# Patient Record
Sex: Male | Born: 1935 | Race: White | Hispanic: No | Smoking: Never smoker
Health system: Southern US, Community
[De-identification: ages and names within clinical notes are randomized; demographics above are authoritative.]

## PROBLEM LIST (undated history)

## (undated) DIAGNOSIS — I10 Essential (primary) hypertension: Secondary | ICD-10-CM

## (undated) DIAGNOSIS — R972 Elevated prostate specific antigen [PSA]: Secondary | ICD-10-CM

## (undated) DIAGNOSIS — I2510 Atherosclerotic heart disease of native coronary artery without angina pectoris: Secondary | ICD-10-CM

## (undated) DIAGNOSIS — E785 Hyperlipidemia, unspecified: Secondary | ICD-10-CM

## (undated) DIAGNOSIS — I219 Acute myocardial infarction, unspecified: Secondary | ICD-10-CM

## (undated) DIAGNOSIS — K219 Gastro-esophageal reflux disease without esophagitis: Secondary | ICD-10-CM

## (undated) DIAGNOSIS — Z86010 Personal history of colonic polyps: Secondary | ICD-10-CM

## (undated) DIAGNOSIS — I493 Ventricular premature depolarization: Secondary | ICD-10-CM

## (undated) DIAGNOSIS — I679 Cerebrovascular disease, unspecified: Secondary | ICD-10-CM

## (undated) HISTORY — DX: Essential (primary) hypertension: I10

## (undated) HISTORY — DX: Gastro-esophageal reflux disease without esophagitis: K21.9

## (undated) HISTORY — DX: Ventricular premature depolarization: I49.3

## (undated) HISTORY — DX: Personal history of colonic polyps: Z86.010

## (undated) HISTORY — DX: Acute myocardial infarction, unspecified: I21.9

## (undated) HISTORY — DX: Cerebrovascular disease, unspecified: I67.9

## (undated) HISTORY — DX: Hyperlipidemia, unspecified: E78.5

## (undated) HISTORY — DX: Atherosclerotic heart disease of native coronary artery without angina pectoris: I25.10

## (undated) HISTORY — PX: TONSILLECTOMY: SUR1361

## (undated) HISTORY — PX: EYE SURGERY: SHX253

## (undated) HISTORY — PX: APPENDECTOMY: SHX54

## (undated) HISTORY — DX: Elevated prostate specific antigen (PSA): R97.20

---

## 1995-05-13 HISTORY — PX: CORONARY ARTERY BYPASS GRAFT: SHX141

## 2004-08-29 ENCOUNTER — Ambulatory Visit: Payer: Self-pay

## 2004-11-20 ENCOUNTER — Ambulatory Visit: Payer: Self-pay | Admitting: Cardiology

## 2004-11-28 ENCOUNTER — Ambulatory Visit: Payer: Self-pay

## 2005-06-19 ENCOUNTER — Ambulatory Visit (HOSPITAL_COMMUNITY): Admission: RE | Admit: 2005-06-19 | Discharge: 2005-06-19 | Payer: Self-pay | Admitting: Ophthalmology

## 2005-12-02 ENCOUNTER — Ambulatory Visit: Payer: Self-pay | Admitting: Cardiology

## 2005-12-24 ENCOUNTER — Ambulatory Visit: Payer: Self-pay

## 2005-12-24 ENCOUNTER — Ambulatory Visit: Payer: Self-pay | Admitting: Cardiology

## 2006-11-10 ENCOUNTER — Ambulatory Visit: Payer: Self-pay

## 2006-11-19 ENCOUNTER — Ambulatory Visit: Payer: Self-pay | Admitting: Cardiology

## 2006-12-03 ENCOUNTER — Ambulatory Visit: Payer: Self-pay

## 2006-12-03 LAB — CONVERTED CEMR LAB
Albumin: 3.6 g/dL (ref 3.5–5.2)
Bilirubin, Direct: 0.1 mg/dL (ref 0.0–0.3)
CO2: 30 meq/L (ref 19–32)
Creatinine, Ser: 1.1 mg/dL (ref 0.4–1.5)
HDL: 38 mg/dL — ABNORMAL LOW (ref >39.0)
Potassium: 4.2 meq/L (ref 3.5–5.1)
Sodium: 142 meq/L (ref 135–145)
Total Bilirubin: 1.1 mg/dL (ref 0.3–1.2)
Total Protein: 6.3 g/dL (ref 6.0–8.3)
Triglycerides: 125 mg/dL (ref 0–149)
VLDL: 25 mg/dL (ref 0–40)

## 2007-11-04 ENCOUNTER — Ambulatory Visit: Payer: Self-pay | Admitting: Cardiology

## 2007-11-10 ENCOUNTER — Ambulatory Visit: Payer: Self-pay

## 2007-11-10 LAB — CONVERTED CEMR LAB
AST: 20 units/L (ref 0–37)
Bilirubin, Direct: 0.2 mg/dL (ref 0.0–0.3)
Chloride: 109 meq/L (ref 96–112)
Cholesterol: 114 mg/dL (ref 0–200)
Creatinine, Ser: 1.3 mg/dL (ref 0.4–1.5)
Glucose, Bld: 100 mg/dL — ABNORMAL HIGH (ref 70–99)
HDL: 32.5 mg/dL — ABNORMAL LOW (ref >39.0)
Magnesium: 2.2 mg/dL (ref 1.5–2.5)
Sodium: 143 meq/L (ref 135–145)
Total Bilirubin: 1 mg/dL (ref 0.3–1.2)

## 2007-11-24 ENCOUNTER — Ambulatory Visit: Payer: Self-pay | Admitting: Cardiology

## 2007-12-29 ENCOUNTER — Ambulatory Visit: Payer: Self-pay | Admitting: Cardiology

## 2007-12-29 LAB — CONVERTED CEMR LAB
AST: 20 units/L (ref 0–37)
Albumin: 3.6 g/dL (ref 3.5–5.2)
Alkaline Phosphatase: 38 units/L — ABNORMAL LOW (ref 39–117)
Total Bilirubin: 1.1 mg/dL (ref 0.3–1.2)
Total CHOL/HDL Ratio: 3
VLDL: 18 mg/dL (ref 0–40)

## 2008-06-05 ENCOUNTER — Ambulatory Visit: Payer: Self-pay | Admitting: Cardiology

## 2008-10-18 ENCOUNTER — Ambulatory Visit: Payer: Self-pay

## 2008-10-18 ENCOUNTER — Ambulatory Visit: Payer: Self-pay | Admitting: Cardiology

## 2008-10-18 LAB — CONVERTED CEMR LAB
AST: 24 units/L (ref 0–37)
Chloride: 107 meq/L (ref 96–112)
Creatinine, Ser: 1 mg/dL (ref 0.4–1.5)
GFR calc Af Amer: 94 mL/min
GFR calc non Af Amer: 78 mL/min
LDL Cholesterol: 59 mg/dL (ref 0–99)
Total Bilirubin: 1 mg/dL (ref 0.3–1.2)
Total CHOL/HDL Ratio: 2.7
Triglycerides: 72 mg/dL (ref 0–149)
VLDL: 14 mg/dL (ref 0–40)

## 2009-01-28 ENCOUNTER — Telehealth: Payer: Self-pay | Admitting: Cardiology

## 2009-03-22 ENCOUNTER — Encounter (INDEPENDENT_AMBULATORY_CARE_PROVIDER_SITE_OTHER): Payer: Self-pay | Admitting: *Deleted

## 2009-05-23 DIAGNOSIS — I4949 Other premature depolarization: Secondary | ICD-10-CM | POA: Insufficient documentation

## 2009-05-23 DIAGNOSIS — I2510 Atherosclerotic heart disease of native coronary artery without angina pectoris: Secondary | ICD-10-CM | POA: Insufficient documentation

## 2009-05-23 DIAGNOSIS — I679 Cerebrovascular disease, unspecified: Secondary | ICD-10-CM | POA: Insufficient documentation

## 2009-05-23 DIAGNOSIS — E785 Hyperlipidemia, unspecified: Secondary | ICD-10-CM | POA: Insufficient documentation

## 2009-05-28 ENCOUNTER — Ambulatory Visit: Payer: Self-pay | Admitting: Cardiology

## 2009-05-28 ENCOUNTER — Encounter (INDEPENDENT_AMBULATORY_CARE_PROVIDER_SITE_OTHER): Payer: Self-pay | Admitting: *Deleted

## 2009-07-23 ENCOUNTER — Ambulatory Visit (HOSPITAL_COMMUNITY): Admission: RE | Admit: 2009-07-23 | Discharge: 2009-07-23 | Payer: Self-pay | Admitting: Cardiology

## 2009-09-17 ENCOUNTER — Telehealth (INDEPENDENT_AMBULATORY_CARE_PROVIDER_SITE_OTHER): Payer: Self-pay | Admitting: *Deleted

## 2010-01-09 ENCOUNTER — Telehealth: Payer: Self-pay | Admitting: Cardiology

## 2010-02-20 ENCOUNTER — Telehealth: Payer: Self-pay | Admitting: Cardiology

## 2010-02-20 DIAGNOSIS — R972 Elevated prostate specific antigen [PSA]: Secondary | ICD-10-CM

## 2010-02-20 HISTORY — DX: Elevated prostate specific antigen (PSA): R97.20

## 2010-05-23 ENCOUNTER — Ambulatory Visit: Payer: Self-pay | Admitting: Cardiology

## 2010-06-04 ENCOUNTER — Telehealth (INDEPENDENT_AMBULATORY_CARE_PROVIDER_SITE_OTHER): Payer: Self-pay | Admitting: *Deleted

## 2010-06-05 ENCOUNTER — Encounter: Payer: Self-pay | Admitting: Cardiology

## 2010-06-05 ENCOUNTER — Ambulatory Visit: Payer: Self-pay | Admitting: Cardiology

## 2010-06-05 ENCOUNTER — Ambulatory Visit: Payer: Self-pay

## 2010-06-05 ENCOUNTER — Encounter (HOSPITAL_COMMUNITY): Admission: RE | Admit: 2010-06-05 | Discharge: 2010-07-09 | Payer: Self-pay | Admitting: Cardiology

## 2010-06-10 LAB — CONVERTED CEMR LAB
ALT: 15 units/L (ref 0–53)
AST: 22 units/L (ref 0–37)
Albumin: 3.6 g/dL (ref 3.5–5.2)
CO2: 29 meq/L (ref 19–32)
Chloride: 105 meq/L (ref 96–112)
Glucose, Bld: 114 mg/dL — ABNORMAL HIGH (ref 70–99)
HDL: 39.4 mg/dL (ref >39.00)
Potassium: 4.1 meq/L (ref 3.5–5.1)
Sodium: 139 meq/L (ref 135–145)
Total Bilirubin: 0.9 mg/dL (ref 0.3–1.2)
Total CHOL/HDL Ratio: 3
Triglycerides: 112 mg/dL (ref 0.0–149.0)
VLDL: 22.4 mg/dL (ref 0.0–40.0)

## 2010-06-19 ENCOUNTER — Telehealth: Payer: Self-pay | Admitting: Cardiology

## 2010-06-27 ENCOUNTER — Ambulatory Visit: Payer: Self-pay | Admitting: Internal Medicine

## 2010-06-27 DIAGNOSIS — Z860100 Personal history of colon polyps, unspecified: Secondary | ICD-10-CM | POA: Insufficient documentation

## 2010-06-27 DIAGNOSIS — K219 Gastro-esophageal reflux disease without esophagitis: Secondary | ICD-10-CM | POA: Insufficient documentation

## 2010-06-27 DIAGNOSIS — I10 Essential (primary) hypertension: Secondary | ICD-10-CM | POA: Insufficient documentation

## 2010-06-27 LAB — CONVERTED CEMR LAB
Cholesterol, target level: 200 mg/dL
HDL goal, serum: 40 mg/dL

## 2010-07-01 ENCOUNTER — Encounter: Admission: RE | Admit: 2010-07-01 | Discharge: 2010-07-01 | Payer: Self-pay | Admitting: Internal Medicine

## 2010-07-01 ENCOUNTER — Encounter: Payer: Self-pay | Admitting: Internal Medicine

## 2010-07-07 ENCOUNTER — Ambulatory Visit: Payer: Self-pay | Admitting: Internal Medicine

## 2010-07-07 DIAGNOSIS — I714 Abdominal aortic aneurysm, without rupture, unspecified: Secondary | ICD-10-CM | POA: Insufficient documentation

## 2010-07-07 DIAGNOSIS — K8020 Calculus of gallbladder without cholecystitis without obstruction: Secondary | ICD-10-CM | POA: Insufficient documentation

## 2010-07-09 ENCOUNTER — Telehealth: Payer: Self-pay | Admitting: Cardiology

## 2010-07-15 ENCOUNTER — Ambulatory Visit: Payer: Self-pay | Admitting: Vascular Surgery

## 2010-07-15 ENCOUNTER — Encounter: Payer: Self-pay | Admitting: Cardiology

## 2010-08-07 ENCOUNTER — Telehealth (INDEPENDENT_AMBULATORY_CARE_PROVIDER_SITE_OTHER): Payer: Self-pay | Admitting: *Deleted

## 2010-08-12 ENCOUNTER — Ambulatory Visit (HOSPITAL_COMMUNITY)
Admission: RE | Admit: 2010-08-12 | Discharge: 2010-08-12 | Payer: Self-pay | Source: Home / Self Care | Admitting: General Surgery

## 2010-08-12 HISTORY — PX: CHOLECYSTECTOMY: SHX55

## 2010-08-28 ENCOUNTER — Ambulatory Visit: Payer: Self-pay | Admitting: Internal Medicine

## 2010-09-09 ENCOUNTER — Encounter: Payer: Self-pay | Admitting: Internal Medicine

## 2010-09-17 ENCOUNTER — Telehealth: Payer: Self-pay | Admitting: Cardiology

## 2010-09-23 ENCOUNTER — Encounter: Payer: Self-pay | Admitting: Gastroenterology

## 2010-10-22 ENCOUNTER — Encounter (INDEPENDENT_AMBULATORY_CARE_PROVIDER_SITE_OTHER): Payer: Self-pay | Admitting: *Deleted

## 2010-10-23 ENCOUNTER — Encounter: Payer: Self-pay | Admitting: Cardiology

## 2010-10-23 ENCOUNTER — Ambulatory Visit
Admission: RE | Admit: 2010-10-23 | Discharge: 2010-10-23 | Payer: Self-pay | Source: Home / Self Care | Attending: Gastroenterology | Admitting: Gastroenterology

## 2010-10-24 ENCOUNTER — Ambulatory Visit: Admission: RE | Admit: 2010-10-24 | Discharge: 2010-10-24 | Payer: Self-pay | Source: Home / Self Care

## 2010-10-24 ENCOUNTER — Encounter: Payer: Self-pay | Admitting: Cardiology

## 2010-11-05 ENCOUNTER — Telehealth: Payer: Self-pay | Admitting: Cardiology

## 2010-11-06 ENCOUNTER — Other Ambulatory Visit: Payer: Self-pay | Admitting: Gastroenterology

## 2010-11-06 ENCOUNTER — Ambulatory Visit
Admission: RE | Admit: 2010-11-06 | Discharge: 2010-11-06 | Payer: Self-pay | Source: Home / Self Care | Attending: Gastroenterology | Admitting: Gastroenterology

## 2010-11-06 DIAGNOSIS — D1330 Benign neoplasm of unspecified part of small intestine: Secondary | ICD-10-CM

## 2010-11-09 LAB — CONVERTED CEMR LAB
ALT: 17 units/L (ref 0–53)
AST: 21 units/L (ref 0–37)
Alkaline Phosphatase: 53 units/L (ref 39–117)
Bilirubin, Direct: 0.1 mg/dL (ref 0.0–0.3)
Chloride: 108 meq/L (ref 96–112)
Cholesterol: 134 mg/dL (ref 0–200)
GFR calc non Af Amer: 62.72 mL/min (ref >60)
LDL Cholesterol: 74 mg/dL (ref 0–99)
Potassium: 4.4 meq/L (ref 3.5–5.1)
Sodium: 142 meq/L (ref 135–145)
Total Bilirubin: 1.1 mg/dL (ref 0.3–1.2)
Total CHOL/HDL Ratio: 3
VLDL: 16 mg/dL (ref 0.0–40.0)

## 2010-11-11 ENCOUNTER — Encounter: Payer: Self-pay | Admitting: Gastroenterology

## 2010-11-13 NOTE — Letter (Signed)
Summary: Medical Clearance/[facility]  [facility] Medical Clearance   Imported By: Roderic Ovens 07/30/2010 14:18:31  _____________________________________________________________________  External Attachment:    Type:   Image     Comment:   External Document

## 2010-11-13 NOTE — Letter (Signed)
Summary: [facility] Instructions  [facility]  [address] [place], [place] [address]   Phone: [phone]  Fax: [phone]       [name]    1932/05/24    MRN: [id number]        Procedure Day /Date:  Thursday 11/06/2010     Arrival Time: 9:30 am     Procedure Time:  10:30 am     Location of Procedure:                    _x _  [facility] (4th Floor)                        PREPARATION FOR COLONOSCOPY WITH MOVIPREP   Starting 5 days prior to your procedure Saturday 1/21 do not eat nuts, seeds, popcorn, corn, beans, peas,  salads, or any raw vegetables.  Do not take any fiber supplements (e.g. Metamucil, Citrucel, and Benefiber).  THE DAY BEFORE YOUR PROCEDURE         DATE: Wednesday 1/25  1.  Drink clear liquids the entire day-NO SOLID FOOD  2.  Do not drink anything colored red or purple.  Avoid juices with pulp.  No orange juice.  3.  Drink at least 64 oz. (8 glasses) of fluid/clear liquids during the day to prevent dehydration and help the prep work efficiently.  CLEAR LIQUIDS INCLUDE: Water Jello Ice Popsicles Tea (sugar ok, no milk/cream) Powdered fruit flavored drinks Coffee (sugar ok, no milk/cream) Gatorade Juice: apple, white grape, white cranberry  Lemonade Clear bullion, consomm, broth Carbonated beverages (any kind) Strained chicken noodle soup Hard Candy                             4.  In the morning, mix first dose of MoviPrep solution:    Empty 1 Pouch A and 1 Pouch B into the disposable container    Add lukewarm drinking water to the top line of the container. Mix to dissolve    Refrigerate (mixed solution should be used within 24 hrs)  5.  Begin drinking the prep at 5:00 p.m. The MoviPrep container is divided by 4 marks.   Every 15 minutes drink the solution down to the next mark (approximately 8 oz) until the full liter is complete.   6.  Follow completed prep with 16 oz of clear liquid of your choice  (Nothing red or purple).  Continue to drink clear liquids until bedtime.  7.  Before going to bed, mix second dose of MoviPrep solution:    Empty 1 Pouch A and 1 Pouch B into the disposable container    Add lukewarm drinking water to the top line of the container. Mix to dissolve    Refrigerate  THE DAY OF YOUR PROCEDURE      DATE: Thursday 1/26  Beginning at 5:30 a.m. (5 hours before procedure):         1. Every 15 minutes, drink the solution down to the next mark (approx 8 oz) until the full liter is complete.  2. Follow completed prep with 16 oz. of clear liquid of your choice.    3. You may drink clear liquids until 8:30 am (2 HOURS BEFORE PROCEDURE).   MEDICATION INSTRUCTIONS  Unless otherwise instructed, you should take regular prescription medications with a small sip of water   as early as possible the morning of  your procedure.           OTHER INSTRUCTIONS  You will need a responsible adult at least 75 years of age to accompany you and drive you home.   This person must remain in the waiting room during your procedure.  Wear loose fitting clothing that is easily removed.  Leave jewelry and other valuables at home.  However, you may wish to bring a book to read or  an iPod/MP3 player to listen to music as you wait for your procedure to start.  Remove all body piercing jewelry and leave at home.  Total time from sign-in until discharge is approximately 2-3 hours.  You should go home directly after your procedure and rest.  You can resume normal activities the  day after your procedure.  The day of your procedure you should not:   Drive   Make legal decisions   Operate machinery   Drink alcohol   Return to work  You will receive specific instructions about eating, activities and medications before you leave.    The above instructions have been reviewed and explained to me by   Ezra Sites RN  October 23, 2010 8:53 AM     I fully understand  and can verbalize these instructions _____________________________ Date _________

## 2010-11-13 NOTE — Progress Notes (Signed)
Summary: Nuc Pre-Procedure  Phone Note Outgoing Call Call back at [facility] [place] Phone [phone]   Call placed by: Antionette Char RN,  June 04, 2010 1:21 PM Call placed to: Patient Reason for Call: Confirm/change Appt Summary of Call: Reviewed information on [employer] Information Sheet (see scanned document for further details).  Spoke with patient.     Nuclear Med Background Indications for Stress Test: Evaluation for Ischemia, Graft Patency   History: CABG, Myocardial Infarction, Myocardial Perfusion Study  History Comments: 1996 IWMI 2009 MPS No Ischemia  Symptoms: DOE    Nuclear Pre-Procedure Cardiac Risk Factors: Carotid Disease, RBBB Height (in): 72

## 2010-11-13 NOTE — Progress Notes (Signed)
  Phone Note Outgoing Call   Call placed by: Scherrie Bateman, LPN,  July 09, 2010 10:13 AM Call placed to: Patient Summary of Call: CALL PLACED TO PT  TO INFORM OF ABD U/S RESULTS.  WHILE GIVING RESULTS PT C/O  WHEN BENDS OVER  NOTES FEELING "WOOZY" LIGHTHEADED.EX TEEING UP FOR GOLF Initial call taken by: Scherrie Bateman, LPN,  July 09, 2010 10:16 AM  Follow-up for Phone Call        continue same and monitor BP Ferman Hamming, MD, [facility]  July 09, 2010 10:19 AM  [facility] Scherrie Bateman, LPN  July 09, 2010 11:52 AM  pt returned call [phone] Glynda Jaeger  July 09, 2010 12:00 PM   Additional Follow-up for Phone Call Additional follow up Details #1::        PT AWARE TO MONITOR B/P AND TO CALL IN APPROX 1 WEEK WITH READINGS  AND TO GIVE UPDATE VERBALIZED UNDERSTANDING Additional Follow-up by: Scherrie Bateman, LPN,  July 09, 2010 12:29 PM

## 2010-11-13 NOTE — Letter (Signed)
Summary: [facility]   Imported By: Sherian Rein 09/16/2010 15:17:29  _____________________________________________________________________  External Attachment:    Type:   Image     Comment:   External Document

## 2010-11-13 NOTE — Miscellaneous (Signed)
Summary: lec pv  Clinical Lists Changes  Medications: Added new medication of MOVIPREP 100 GM  SOLR (PEG-KCL-NACL-NASULF-NA ASC-C) As per prep instructions. - Signed Rx of MOVIPREP 100 GM  SOLR (PEG-KCL-NACL-NASULF-NA ASC-C) As per prep instructions.;  #1 x 0;  Signed;  Entered by: Ezra Sites RN;  Authorized by: Meryl Dare MD [facility];  Method used: Electronically to [id number] [address] [place], [place]  [address], Ph: [phone] or [phone], Fax: [phone] Observations: Added new observation of NKA: T (10/23/2010 8:16)    Prescriptions: MOVIPREP 100 GM  SOLR (PEG-KCL-NACL-NASULF-NA ASC-C) As per prep instructions.  #1 x 0   Entered by:   Ezra Sites RN   Authorized by:   Meryl Dare MD [facility]   Signed by:   Ezra Sites RN on 10/23/2010   Method used:   Electronically to        [id number] (retail)       [address]       [place], [place]  [address]       Ph: [phone] or [phone]       Fax: [phone]   RxID:   [id number]   Appended Document: lec pv Dr. Yetta Barre problem list says that pt has a hx of colon polyps.  Pt says that he has never had a colonoscopy.

## 2010-11-13 NOTE — Assessment & Plan Note (Signed)
Summary: follow up on test results-lb   Vital Signs:  Patient profile:   77 year old male Height:      72 inches Weight:      193.25 pounds BMI:     26.30 O2 Sat:      95 % on Room air Temp:     97.8 degrees F oral Pulse rate:   66 / minute Pulse rhythm:   regular Resp:     16 per minute BP sitting:   122 / 74  (left arm) Cuff size:   large  Vitals Entered By: Rock Nephew CMA (July 07, 2010 8:14 AM)  Nutrition Counseling: Patient's BMI is greater than 25 and therefore counseled on weight management options.  O2 Flow:  Room air CC: follow-up visit//discuss lab work Pain Assessment Patient in pain? no       Does patient need assistance? Functional Status Self care Ambulation Normal   Primary Care Provider:  Etta Grandchild MD  CC:  follow-up visit//discuss lab work.  History of Present Illness: He returns for f/up on the abd u/s. It found one gallstone and a small/distal AAA. He has not had anymore abd pain and feels well with no complaints today.  Preventive Screening-Counseling & Management  Alcohol-Tobacco     Alcohol drinks/day: 0     Smoking Status: never     Tobacco Counseling: not indicated; no tobacco use  Hep-HIV-STD-Contraception     Hepatitis Risk: no risk noted     HIV Risk: no risk noted     STD Risk: no risk noted      Sexual History:  currently monogamous.        Drug Use:  never.        Blood Transfusions:  no.    Clinical Review Panels:  Lipid Management   Cholesterol:  114 (06/05/2010)   LDL (bad choesterol):  52 (06/05/2010)   HDL (good cholesterol):  39.40 (06/05/2010)  Complete Metabolic Panel   Glucose:  114 (06/05/2010)   Sodium:  139 (06/05/2010)   Potassium:  4.1 (06/05/2010)   Chloride:  105 (06/05/2010)   CO2:  29 (06/05/2010)   BUN:  15 (06/05/2010)   Creatinine:  1.2 (06/05/2010)   Albumin:  3.6 (06/05/2010)   Total Protein:  6.2 (06/05/2010)   Calcium:  9.5 (06/05/2010)   Total Bili:  0.9 (06/05/2010)  Alk Phos:  49 (06/05/2010)   SGPT (ALT):  15 (06/05/2010)   SGOT (AST):  22 (06/05/2010)   Medications Prior to Update: 1)  Altace 5 Mg Caps (Ramipril) .... Take 1 Capsule By Mouth Once A Day 2)  Niaspan 1000 Mg Cr-Tabs (Niacin (Antihyperlipidemic)) .Marland Kitchen.. 1 Tablet By Mouth At Bedtime 3)  Pravastatin Sodium 40 Mg Tabs (Pravastatin Sodium) .... Take 1 Tablet By Mouth At Bedtime 4)  Multivitamins   Tabs (Multiple Vitamin) .Marland Kitchen.. 1 Tab By Mouth Once Daily 5)  Aspirin 81 Mg  Tabs (Aspirin) .Marland Kitchen.. 1 Tab By Mouth Once Daily 6)  Calcium 600-200 Mg-Unit Tabs (Calcium-Vitamin D) .... Take 1 By Mouth Daily 7)  Vitamin C 500 Mg Tabs (Ascorbic Acid) .Marland Kitchen.. 1 Tab By Mouth Once Daily  Current Medications (verified): 1)  Altace 5 Mg Caps (Ramipril) .... Take 1 Capsule By Mouth Once A Day 2)  Niaspan 1000 Mg Cr-Tabs (Niacin (Antihyperlipidemic)) .Marland Kitchen.. 1 Tablet By Mouth At Bedtime 3)  Pravastatin Sodium 40 Mg Tabs (Pravastatin Sodium) .... Take 1 Tablet By Mouth At Bedtime 4)  Multivitamins   Tabs (Multiple Vitamin) .Marland KitchenMarland KitchenMarland Kitchen  1 Tab By Mouth Once Daily 5)  Aspirin 81 Mg  Tabs (Aspirin) .Marland Kitchen.. 1 Tab By Mouth Once Daily 6)  Calcium 600-200 Mg-Unit Tabs (Calcium-Vitamin D) .... Take 1 By Mouth Daily 7)  Vitamin C 500 Mg Tabs (Ascorbic Acid) .Marland Kitchen.. 1 Tab By Mouth Once Daily  Allergies (verified): No Known Drug Allergies  Past History:  Past Medical History: Last updated: 06/27/2010 CEREBROVASCULAR DISEASE (ICD-437.9) PREMATURE VENTRICULAR CONTRACTIONS (ICD-427.69) HYPERLIPIDEMIA (ICD-272.4) CAD (ICD-414.00) Colonic polyps, hx of GERD Hypertension PSA= 1.7 on 02/20/2010 with Dr. Earlene Plater  Past Surgical History: Last updated: 06/27/2010 status post coronary artery bypassing graft. August 1996 with LIMA to the LAD and diagonal, saphenous vein graft to the intermediate, obtuse marginal and circumflex, and a saphenous vein graft to the right coronary artery and PDA. Appendectomy Tonsillectomy  Family History: Last  updated: 06/27/2010 Family History of Prostate CA 1st degree relative <50  Social History: Last updated: 06/27/2010 Tobacco Use - No.  Alcohol Use - no Retired Widow/Widower Regular exercise-yes  Risk Factors: Alcohol Use: 0 (07/07/2010) Exercise: yes (06/27/2010)  Risk Factors: Smoking Status: never (07/07/2010)  Family History: Reviewed history from 06/27/2010 and no changes required. Family History of Prostate CA 1st degree relative <50  Social History: Reviewed history from 06/27/2010 and no changes required. Tobacco Use - No.  Alcohol Use - no Retired Widow/Widower Regular exercise-yes  Review of Systems  The patient denies anorexia, fever, weight loss, weight gain, chest pain, syncope, dyspnea on exertion, peripheral edema, prolonged cough, headaches, hemoptysis, abdominal pain, and hematuria.   CV:  Denies chest pain or discomfort, fainting, fatigue, leg cramps with exertion, lightheadness, near fainting, palpitations, and shortness of breath with exertion.  Physical Exam  General:  alert, well-developed, well-nourished, well-hydrated, appropriate dress, normal appearance, healthy-appearing, cooperative to examination, and good hygiene.   Eyes:  vision grossly intact, pupils equal, pupils round, and pupils reactive to light.   Mouth:  Oral mucosa and oropharynx without lesions or exudates.  Teeth in good repair. Neck:  supple, full ROM, no masses, no thyromegaly, no thyroid nodules or tenderness, no JVD, normal carotid upstroke, no carotid bruits, no cervical lymphadenopathy, and no neck tenderness.   Lungs:  normal respiratory effort, no intercostal retractions, no accessory muscle use, normal breath sounds, no dullness, no fremitus, no crackles, and no wheezes.   Heart:  normal rate, regular rhythm, no murmur, no gallop, no rub, and no JVD.   Abdomen:  soft, non-tender, normal bowel sounds, no distention, no masses, no guarding, no rigidity, no rebound tenderness,  no abdominal hernia, no inguinal hernia, no hepatomegaly, and no splenomegaly.   Msk:  No deformity or scoliosis noted of thoracic or lumbar spine.   Pulses:  R and L carotid,radial,femoral,dorsalis pedis and posterior tibial pulses are full and equal bilaterally Extremities:  No clubbing, cyanosis, edema, or deformity noted with normal full range of motion of all joints.   Neurologic:  No cranial nerve deficits noted. Station and gait are normal. Plantar reflexes are down-going bilaterally. DTRs are symmetrical throughout. Sensory, motor and coordinative functions appear intact. Skin:  turgor normal, color normal, no rashes, no suspicious lesions, no ecchymoses, no petechiae, no purpura, no ulcerations, no edema, excessive tan, and solar damage.   Cervical Nodes:  no anterior cervical adenopathy and no posterior cervical adenopathy.   Psych:  Cognition and judgment appear intact. Alert and cooperative with normal attention span and concentration. No apparent delusions, illusions, hallucinations   Impression & Recommendations:  Problem # 1:  CHOLELITHIASIS (ICD-574.20) Assessment New he may need a cholecystectomy Orders: Surgical Referral (Surgery)  Problem # 2:  ABDOMINAL ANEURYSM WITHOUT MENTION OF RUPTURE (ICD-441.4) Assessment: New will see if vascular surgery has any recommendations Orders: Vascular Clinic (Vascular)  Problem # 3:  HYPERTENSION (ICD-401.9) Assessment: Improved  His updated medication list for this problem includes:    Altace 5 Mg Caps (Ramipril) .Marland Kitchen... Take 1 capsule by mouth once a day  BP today: 122/74 Prior BP: 124/62 (06/27/2010)  Prior 10 Yr Risk Heart Disease: N/A (06/27/2010)  Labs Reviewed: K+: 4.1 (06/05/2010) Creat: : 1.2 (06/05/2010)   Chol: 114 (06/05/2010)   HDL: 39.40 (06/05/2010)   LDL: 52 (06/05/2010)   TG: 112.0 (06/05/2010)  Complete Medication List: 1)  Altace 5 Mg Caps (Ramipril) .... Take 1 capsule by mouth once a day 2)  Niaspan 1000  Mg Cr-tabs (Niacin (antihyperlipidemic)) .Marland Kitchen.. 1 tablet by mouth at bedtime 3)  Pravastatin Sodium 40 Mg Tabs (Pravastatin sodium) .... Take 1 tablet by mouth at bedtime 4)  Multivitamins Tabs (Multiple vitamin) .Marland Kitchen.. 1 tab by mouth once daily 5)  Aspirin 81 Mg Tabs (Aspirin) .Marland Kitchen.. 1 tab by mouth once daily 6)  Calcium 600-200 Mg-unit Tabs (Calcium-vitamin d) .... Take 1 by mouth daily 7)  Vitamin C 500 Mg Tabs (Ascorbic acid) .Marland Kitchen.. 1 tab by mouth once daily  Patient Instructions: 1)  Please schedule a follow-up appointment in 1 month. 2)  Check your Blood Pressure regularly. If it is above 130/80: you should make an appointment.

## 2010-11-13 NOTE — Assessment & Plan Note (Signed)
Summary: NEW MEDICARE PT--#--PKG--DEBRA/DR CRENSHAW   Vital Signs:  Patient profile:   77 year old male Height:      72 inches Weight:      196.13 pounds O2 Sat:      98 % on Room air Temp:     97.8 degrees F oral Pulse rate:   62 / minute Pulse rhythm:   regular Resp:     16 per minute BP sitting:   124 / 62  (left arm)  Vitals Entered By: Cristy Hilts, RN (June 27, 2010 2:48 PM)  O2 Flow:  Room air CC: new patient to establish, Preventive Care, Lipid Management Is Patient Diabetic? No   Primary Care Provider:  Etta Grandchild MD  CC:  new patient to establish, Preventive Care, and Lipid Management.  History of Present Illness: New to me this gentleman says that he had an attack of pain in his upper abd about 2 weeks ago and he was seen at an ER in [place] and had a CTscan done that showed a spasm in his gallbladder and a possible stone. He has felt well since then. He has mild acid reflux disease and gets relief with tums and he does not want to take a medicine for that. He thinks he has had colon polys before but when I ask him who did the colonoscopy he said it was a Insurance underwriter named Dr. Earlene Plater.  Lipid Management History:      Positive NCEP/ATP III risk factors include male age 24 years old or older, HDL cholesterol less than 40, hypertension, and ASHD (either angina/prior MI/prior CABG).  Negative NCEP/ATP III risk factors include non-diabetic, no family history for ischemic heart disease, non-tobacco-user status, no prior stroke/TIA, no peripheral vascular disease, and no history of aortic aneurysm.        The patient states that he knows about the "Therapeutic Lifestyle Change" diet.  His compliance with the TLC diet is excellent.  The patient expresses understanding of adjunctive measures for cholesterol lowering.  Adjunctive measures started by the patient include aerobic exercise, fiber, ASA, omega-3 supplements, sitostanol esters, limit alcohol consumpton, and weight  reduction.  He expresses no side effects from his lipid-lowering medication.  The patient denies any symptoms to suggest myopathy or liver disease.     Preventive Screening-Counseling & Management  Alcohol-Tobacco     Alcohol drinks/day: 0     Smoking Status: never     Tobacco Counseling: not indicated; no tobacco use  Caffeine-Diet-Exercise     Does Patient Exercise: yes  Hep-HIV-STD-Contraception     Hepatitis Risk: no risk noted     HIV Risk: no risk noted     STD Risk: no risk noted      Sexual History:  currently monogamous.        Drug Use:  never.        Blood Transfusions:  no.    Current Medications (verified): 1)  Altace 5 Mg Caps (Ramipril) .... Take 1 Capsule By Mouth Once A Day 2)  Niaspan 1000 Mg Cr-Tabs (Niacin (Antihyperlipidemic)) .Marland Kitchen.. 1 Tablet By Mouth At Bedtime 3)  Pravastatin Sodium 40 Mg Tabs (Pravastatin Sodium) .... Take 1 Tablet By Mouth At Bedtime 4)  Multivitamins   Tabs (Multiple Vitamin) .Marland Kitchen.. 1 Tab By Mouth Once Daily 5)  Aspirin 81 Mg  Tabs (Aspirin) .Marland Kitchen.. 1 Tab By Mouth Once Daily 6)  Calcium 600-200 Mg-Unit Tabs (Calcium-Vitamin D) .... Take 1 By Mouth Daily 7)  Vitamin C 500  Mg Tabs (Ascorbic Acid) .Marland Kitchen.. 1 Tab By Mouth Once Daily  Allergies (verified): No Known Drug Allergies  Past History:  Past Medical History: CEREBROVASCULAR DISEASE (ICD-437.9) PREMATURE VENTRICULAR CONTRACTIONS (ICD-427.69) HYPERLIPIDEMIA (ICD-272.4) CAD (ICD-414.00) Colonic polyps, hx of GERD Hypertension PSA= 1.7 on 02/20/2010 with Dr. Earlene Plater  Past Surgical History: status post coronary artery bypassing graft. August 1996 with LIMA to the LAD and diagonal, saphenous vein graft to the intermediate, obtuse marginal and circumflex, and a saphenous vein graft to the right coronary artery and PDA. Appendectomy Tonsillectomy  Family History: Family History of Prostate CA 1st degree relative <50  Social History: Tobacco Use - No.  Alcohol Use -  no Retired Conservation officer, [employer] Regular exercise-yes Hepatitis Risk:  no risk noted HIV Risk:  no risk noted STD Risk:  no risk noted Sexual History:  currently monogamous Drug Use:  never Blood Transfusions:  no Does Patient Exercise:  yes  Review of Systems       The patient complains of severe indigestion/heartburn.  The patient denies anorexia, fever, weight loss, weight gain, chest pain, syncope, dyspnea on exertion, peripheral edema, prolonged cough, headaches, hemoptysis, abdominal pain, melena, hematochezia, hematuria, difficulty walking, depression, enlarged lymph nodes, angioedema, and testicular masses.    Physical Exam  General:  alert, well-developed, well-nourished, well-hydrated, appropriate dress, normal appearance, healthy-appearing, cooperative to examination, and good hygiene.   Head:  normocephalic, atraumatic, no abnormalities observed, and no abnormalities palpated.   Eyes:  vision grossly intact, pupils equal, pupils round, and pupils reactive to light.   Mouth:  Oral mucosa and oropharynx without lesions or exudates.  Teeth in good repair. Neck:  supple, full ROM, no masses, no thyromegaly, no thyroid nodules or tenderness, no JVD, normal carotid upstroke, no carotid bruits, no cervical lymphadenopathy, and no neck tenderness.   Lungs:  normal respiratory effort, no intercostal retractions, no accessory muscle use, normal breath sounds, no dullness, no fremitus, no crackles, and no wheezes.   Heart:  normal rate, regular rhythm, no murmur, no gallop, no rub, and no JVD.   Abdomen:  soft, non-tender, normal bowel sounds, no distention, no masses, no guarding, no rigidity, no rebound tenderness, no abdominal hernia, no inguinal hernia, no hepatomegaly, and no splenomegaly.   Msk:  No deformity or scoliosis noted of thoracic or lumbar spine.   Pulses:  R and L carotid,radial,femoral,dorsalis pedis and posterior tibial pulses are full and equal bilaterally Extremities:  No  clubbing, cyanosis, edema, or deformity noted with normal full range of motion of all joints.   Neurologic:  No cranial nerve deficits noted. Station and gait are normal. Plantar reflexes are down-going bilaterally. DTRs are symmetrical throughout. Sensory, motor and coordinative functions appear intact. Skin:  turgor normal, color normal, no rashes, no suspicious lesions, no ecchymoses, no petechiae, no purpura, no ulcerations, no edema, excessive tan, and solar damage.   Cervical Nodes:  no anterior cervical adenopathy and no posterior cervical adenopathy.   Axillary Nodes:  no R axillary adenopathy and no L axillary adenopathy.   Inguinal Nodes:  no R inguinal adenopathy and no L inguinal adenopathy.   Psych:  Cognition and judgment appear intact. Alert and cooperative with normal attention span and concentration. No apparent delusions, illusions, hallucinations   Impression & Recommendations:  Problem # 1:  BILIARY COLIC (ICD-574.20) Assessment New will check an u/s and if indeed he has significant gallstones then we'll have to talk about a cholecystectomy Orders: Radiology Referral (Radiology)  Problem # 2:  GERD (ICD-530.81) Assessment:  New he does not want to treat it  Problem # 3:  COLONIC POLYPS, HX OF (ICD-V12.72)  he may need a colonoscopy  Orders: Gastroenterology Referral (GI)  Problem # 4:  HYPERTENSION (ICD-401.9) Assessment: Improved  His updated medication list for this problem includes:    Altace 5 Mg Caps (Ramipril) .Marland Kitchen... Take 1 capsule by mouth once a day  BP today: 124/62 Prior BP: 108/60 (05/23/2010)  10 Yr Risk Heart Disease: N/A  Labs Reviewed: K+: 4.1 (06/05/2010) Creat: : 1.2 (06/05/2010)   Chol: 114 (06/05/2010)   HDL: 39.40 (06/05/2010)   LDL: 52 (06/05/2010)   TG: 112.0 (06/05/2010)  Complete Medication List: 1)  Altace 5 Mg Caps (Ramipril) .... Take 1 capsule by mouth once a day 2)  Niaspan 1000 Mg Cr-tabs (Niacin (antihyperlipidemic)) .Marland Kitchen.. 1  tablet by mouth at bedtime 3)  Pravastatin Sodium 40 Mg Tabs (Pravastatin sodium) .... Take 1 tablet by mouth at bedtime 4)  Multivitamins Tabs (Multiple vitamin) .Marland Kitchen.. 1 tab by mouth once daily 5)  Aspirin 81 Mg Tabs (Aspirin) .Marland Kitchen.. 1 tab by mouth once daily 6)  Calcium 600-200 Mg-unit Tabs (Calcium-vitamin d) .... Take 1 by mouth daily 7)  Vitamin C 500 Mg Tabs (Ascorbic acid) .Marland Kitchen.. 1 tab by mouth once daily  Other Orders: Tdap => 104yrs IM (16109) Admin 1st Vaccine (60454) Flu Vaccine 52yrs + MEDICARE PATIENTS (U9811) Administration Flu vaccine - MCR (G0008)  Lipid Assessment/Plan:      Based on NCEP/ATP III, the patient's risk factor category is "history of coronary disease, peripheral vascular disease, cerebrovascular disease, or aortic aneurysm".  The patient's lipid goals are as follows: Total cholesterol goal is 200; LDL cholesterol goal is 100; HDL cholesterol goal is 40; Triglyceride goal is 150.    Colorectal Screening:  Current Recommendations:    Colonoscopy recommended: scheduled with [facility].  PSA Screening:    Reviewed PSA screening recommendations: Pro's and Cons's of PSA discussed and patient chooses to defer  Immunization & Chemoprophylaxis:    Tetanus vaccine: Tdap  (06/27/2010)    Influenza vaccine: Fluvax 3+  (06/27/2010)  Patient Instructions: 1)  Please schedule a follow-up appointment in 2 months. 2)  Avoid foods high in acid (tomatoes, citrus juices, spicy foods). Avoid eating within two hours of lying down or before exercising. Do not over eat; try smaller more frequent meals. Elevate head of bed twelve inches when sleeping.    Immunizations Administered:  Tetanus Vaccine:    Vaccine Type: Tdap    Site: right deltoid    Mfr: [employer]Smith[employer]    Dose: 0.5 ml    Route: IM    Given by: Cristy Hilts, RN    Exp. Date: 07/02/2012    Lot #: [id number]    VIS given: 08/29/08 version given June 27, 2010. Flu Vaccine Consent Questions     Do you have a  history of severe allergic reactions to this vaccine? no    Any prior history of allergic reactions to egg and/or gelatin? no    Do you have a sensitivity to the preservative Thimersol? no    Do you have a past history of Guillan-Barre Syndrome? no    Do you currently have an acute febrile illness? no    Have you ever had a severe reaction to latex? no    Vaccine information given and explained to patient? yes    Are you currently pregnant? no    Lot Number:[id number]   Exp Date:04/11/2011   Site Given  Left  Deltoid [id number]    VIS given: 08/29/08 version given June 27, 2010.    Marland Kitchenlbmedflu

## 2010-11-13 NOTE — Assessment & Plan Note (Signed)
Summary: 2 MO ROV / NWS  #   Vital Signs:  Patient profile:   77 year old male Height:      72 inches Weight:      186.25 pounds BMI:     25.35 O2 Sat:      97 % on Room air Temp:     98.2 degrees F oral Pulse rate:   57 / minute Pulse rhythm:   regular Resp:     16 per minute BP sitting:   108 / 64  (left arm) Cuff size:   large  Vitals Entered By: Rock Nephew CMA (August 28, 2010 8:32 AM)  Nutrition Counseling: Patient's BMI is greater than 25 and therefore counseled on weight management options.  O2 Flow:  Room air  Primary Care Provider:  Etta Grandchild MD   History of Present Illness:  Hypertension Follow-Up      This is a 77 year old man who presents for Hypertension follow-up.  The patient denies lightheadedness, urinary frequency, headaches, edema, impotence, rash, and fatigue.  The patient denies the following associated symptoms: chest pain, chest pressure, exercise intolerance, dyspnea, palpitations, syncope, leg edema, and pedal edema.  Compliance with medications (by patient report) has been near 100%.  The patient reports that dietary compliance has been good.  The patient reports exercising occasionally.  Adjunctive measures currently used by the patient include salt restriction and relaxation.    Lipid Management History:      Positive NCEP/ATP III risk factors include male age 75 years old or older, HDL cholesterol less than 40, hypertension, ASHD (either angina/prior MI/prior CABG), peripheral vascular disease, and aortic aneurysm.  Negative NCEP/ATP III risk factors include non-diabetic, no family history for ischemic heart disease, non-tobacco-user status, and no prior stroke/TIA.        The patient states that he knows about the "Therapeutic Lifestyle Change" diet.  His compliance with the TLC diet is excellent.  The patient expresses understanding of adjunctive measures for cholesterol lowering.  Adjunctive measures started by the patient include aerobic  exercise, fiber, ASA, omega-3 supplements, limit alcohol consumpton, and weight reduction.  He expresses no side effects from his lipid-lowering medication.  The patient denies any symptoms to suggest myopathy or liver disease.     Preventive Screening-Counseling & Management  Alcohol-Tobacco     Alcohol drinks/day: 0     Alcohol Counseling: not indicated; patient does not drink     Smoking Status: never     Tobacco Counseling: not indicated; no tobacco use  Hep-HIV-STD-Contraception     Hepatitis Risk: no risk noted     HIV Risk: no risk noted     STD Risk: no risk noted      Sexual History:  currently monogamous.        Drug Use:  never.        Blood Transfusions:  no.    Clinical Review Panels:  Immunizations   Last Tetanus Booster:  Tdap (06/27/2010)   Last Flu Vaccine:  Fluvax 3+ (06/27/2010)  Lipid Management   Cholesterol:  114 (06/05/2010)   LDL (bad choesterol):  52 (06/05/2010)   HDL (good cholesterol):  39.40 (06/05/2010)  Diabetes Management   Creatinine:  1.2 (06/05/2010)   Last Flu Vaccine:  Fluvax 3+ (06/27/2010)  Complete Metabolic Panel   Glucose:  114 (06/05/2010)   Sodium:  139 (06/05/2010)   Potassium:  4.1 (06/05/2010)   Chloride:  105 (06/05/2010)   CO2:  29 (06/05/2010)   BUN:  15 (06/05/2010)   Creatinine:  1.2 (06/05/2010)   Albumin:  3.6 (06/05/2010)   Total Protein:  6.2 (06/05/2010)   Calcium:  9.5 (06/05/2010)   Total Bili:  0.9 (06/05/2010)   Alk Phos:  49 (06/05/2010)   SGPT (ALT):  15 (06/05/2010)   SGOT (AST):  22 (06/05/2010)   Medications Prior to Update: 1)  Altace 5 Mg Caps (Ramipril) .... Take 1 Capsule By Mouth Once A Day 2)  Niaspan 1000 Mg Cr-Tabs (Niacin (Antihyperlipidemic)) .Marland Kitchen.. 1 Tablet By Mouth At Bedtime 3)  Pravastatin Sodium 40 Mg Tabs (Pravastatin Sodium) .... Take 1 Tablet By Mouth At Bedtime 4)  Multivitamins   Tabs (Multiple Vitamin) .Marland Kitchen.. 1 Tab By Mouth Once Daily 5)  Aspirin 81 Mg  Tabs (Aspirin) .Marland Kitchen.. 1  Tab By Mouth Once Daily 6)  Calcium 600-200 Mg-Unit Tabs (Calcium-Vitamin D) .... Take 1 By Mouth Daily 7)  Vitamin C 500 Mg Tabs (Ascorbic Acid) .Marland Kitchen.. 1 Tab By Mouth Once Daily  Current Medications (verified): 1)  Altace 5 Mg Caps (Ramipril) .... Take 1 Capsule By Mouth Once A Day 2)  Niaspan 1000 Mg Cr-Tabs (Niacin (Antihyperlipidemic)) .Marland Kitchen.. 1 Tablet By Mouth At Bedtime 3)  Pravastatin Sodium 40 Mg Tabs (Pravastatin Sodium) .... Take 1 Tablet By Mouth At Bedtime 4)  Multivitamins   Tabs (Multiple Vitamin) .Marland Kitchen.. 1 Tab By Mouth Once Daily 5)  Aspirin 81 Mg  Tabs (Aspirin) .Marland Kitchen.. 1 Tab By Mouth Once Daily 6)  Calcium 600-200 Mg-Unit Tabs (Calcium-Vitamin D) .... Take 1 By Mouth Daily 7)  Vitamin C 500 Mg Tabs (Ascorbic Acid) .Marland Kitchen.. 1 Tab By Mouth Once Daily  Allergies (verified): No Known Drug Allergies  Past History:  Family History: Last updated: 06/27/2010 Family History of Prostate CA 1st degree relative <50  Social History: Last updated: 06/27/2010 Tobacco Use - No.  Alcohol Use - no Retired Widow/Widower Regular exercise-yes  Risk Factors: Alcohol Use: 0 (08/28/2010) Exercise: yes (06/27/2010)  Risk Factors: Smoking Status: never (08/28/2010)  Past Medical History: Reviewed history from 06/27/2010 and no changes required. CEREBROVASCULAR DISEASE (ICD-437.9) PREMATURE VENTRICULAR CONTRACTIONS (ICD-427.69) HYPERLIPIDEMIA (ICD-272.4) CAD (ICD-414.00) Colonic polyps, hx of GERD Hypertension PSA= 1.7 on 02/20/2010 with Dr. Earlene Plater  Past Surgical History: status post coronary artery bypassing graft. August 1996 with LIMA to the LAD and diagonal, saphenous vein graft to the intermediate, obtuse marginal and circumflex, and a saphenous vein graft to the right coronary artery and PDA. Appendectomy Tonsillectomy Cholecystectomy- Nov 2011  Family History: Reviewed history from 06/27/2010 and no changes required. Family History of Prostate CA 1st degree relative <50  Social  History: Reviewed history from 06/27/2010 and no changes required. Tobacco Use - No.  Alcohol Use - no Retired Widow/Widower Regular exercise-yes  Review of Systems  The patient denies anorexia, fever, weight loss, weight gain, chest pain, peripheral edema, prolonged cough, headaches, hemoptysis, abdominal pain, suspicious skin lesions, and enlarged lymph nodes.    Physical Exam  General:  alert, well-developed, well-nourished, well-hydrated, appropriate dress, normal appearance, healthy-appearing, cooperative to examination, and good hygiene.   Head:  normocephalic, atraumatic, no abnormalities observed, and no abnormalities palpated.   Mouth:  Oral mucosa and oropharynx without lesions or exudates.  Teeth in good repair. Neck:  supple, full ROM, no masses, no thyromegaly, no thyroid nodules or tenderness, no JVD, normal carotid upstroke, no carotid bruits, no cervical lymphadenopathy, and no neck tenderness.   Lungs:  normal respiratory effort, no intercostal retractions, no accessory muscle use, normal breath sounds,  no dullness, no fremitus, no crackles, and no wheezes.   Heart:  normal rate, regular rhythm, no murmur, no gallop, no rub, and no JVD.   Abdomen:  soft, non-tender, normal bowel sounds, no distention, no masses, no guarding, no rigidity, no rebound tenderness, no abdominal hernia, no inguinal hernia, no hepatomegaly, and no splenomegaly.  abdominal scar(s).   Msk:  No deformity or scoliosis noted of thoracic or lumbar spine.   Pulses:  R and L carotid,radial,femoral,dorsalis pedis and posterior tibial pulses are full and equal bilaterally Extremities:  No clubbing, cyanosis, edema, or deformity noted with normal full range of motion of all joints.   Neurologic:  No cranial nerve deficits noted. Station and gait are normal. Plantar reflexes are down-going bilaterally. DTRs are symmetrical throughout. Sensory, motor and coordinative functions appear intact. Skin:  turgor  normal, color normal, no rashes, no suspicious lesions, no ecchymoses, no petechiae, no purpura, no ulcerations, no edema, excessive tan, and solar damage.   Cervical Nodes:  no anterior cervical adenopathy and no posterior cervical adenopathy.   Psych:  Cognition and judgment appear intact. Alert and cooperative with normal attention span and concentration. No apparent delusions, illusions, hallucinations   Impression & Recommendations:  Problem # 1:  HYPERTENSION (ICD-401.9) Assessment Improved  His updated medication list for this problem includes:    Altace 5 Mg Caps (Ramipril) .Marland Kitchen... Take 1 capsule by mouth once a day  BP today: 108/64 Prior BP: 122/74 (07/07/2010)  Prior 10 Yr Risk Heart Disease: N/A (06/27/2010)  Labs Reviewed: K+: 4.1 (06/05/2010) Creat: : 1.2 (06/05/2010)   Chol: 114 (06/05/2010)   HDL: 39.40 (06/05/2010)   LDL: 52 (06/05/2010)   TG: 112.0 (06/05/2010)  Problem # 2:  HYPERLIPIDEMIA (ICD-272.4) Assessment: Unchanged  His updated medication list for this problem includes:    Niaspan 1000 Mg Cr-tabs (Niacin (antihyperlipidemic)) .Marland Kitchen... 1 tablet by mouth at bedtime    Pravastatin Sodium 40 Mg Tabs (Pravastatin sodium) .Marland Kitchen... Take 1 tablet by mouth at bedtime  Labs Reviewed: SGOT: 22 (06/05/2010)   SGPT: 15 (06/05/2010)  Lipid Goals: Chol Goal: 200 (06/27/2010)   HDL Goal: 40 (06/27/2010)   LDL Goal: 100 (06/27/2010)   TG Goal: 150 (06/27/2010)  Prior 10 Yr Risk Heart Disease: N/A (06/27/2010)   HDL:39.40 (06/05/2010), 43.80 (05/28/2009)  LDL:52 (06/05/2010), 74 (05/28/2009)  Chol:114 (06/05/2010), 134 (05/28/2009)  Trig:112.0 (06/05/2010), 80.0 (05/28/2009)  Complete Medication List: 1)  Altace 5 Mg Caps (Ramipril) .... Take 1 capsule by mouth once a day 2)  Niaspan 1000 Mg Cr-tabs (Niacin (antihyperlipidemic)) .Marland Kitchen.. 1 tablet by mouth at bedtime 3)  Pravastatin Sodium 40 Mg Tabs (Pravastatin sodium) .... Take 1 tablet by mouth at bedtime 4)  Multivitamins  Tabs (Multiple vitamin) .Marland Kitchen.. 1 tab by mouth once daily 5)  Aspirin 81 Mg Tabs (Aspirin) .Marland Kitchen.. 1 tab by mouth once daily 6)  Calcium 600-200 Mg-unit Tabs (Calcium-vitamin d) .... Take 1 by mouth daily 7)  Vitamin C 500 Mg Tabs (Ascorbic acid) .Marland Kitchen.. 1 tab by mouth once daily  Lipid Assessment/Plan:      Based on NCEP/ATP III, the patient's risk factor category is "history of coronary disease, peripheral vascular disease, cerebrovascular disease, or aortic aneurysm".  The patient's lipid goals are as follows: Total cholesterol goal is 200; LDL cholesterol goal is 100; HDL cholesterol goal is 40; Triglyceride goal is 150.    Patient Instructions: 1)  Please schedule a follow-up appointment in 4 months. 2)  It is important that you exercise regularly  at least 20 minutes 5 times a week. If you develop chest pain, have severe difficulty breathing, or feel very tired , stop exercising immediately and seek medical attention. 3)  You need to lose weight. Consider a lower calorie diet and regular exercise.  4)  Check your Blood Pressure regularly. If it is above 130/80: you should make an appointment.   Orders Added: 1)  Est. Patient Level III [09811]

## 2010-11-13 NOTE — Miscellaneous (Signed)
Summary: Orders Update  Clinical Lists Changes  Orders: Added new Test order of Carotid Duplex (Carotid Duplex) - Signed 

## 2010-11-13 NOTE — Progress Notes (Signed)
  Phone Note Other Incoming   Caller: Delice Bison Reason for Call: Get patient information Request: Send information Action Taken: Information Sent Summary of Call: Delice Bison from [facility] Pre- Surg admitting called to have the last ekg, office note and stress test faxed over. Records were faxed to the attention of Carney Bern. Initial call taken by: Dixie Dials

## 2010-11-13 NOTE — Assessment & Plan Note (Signed)
Summary: Cardiology Nuclear Testing  Nuclear Med Background Indications for Stress Test: Evaluation for Ischemia, Graft Patency   History: CABG, Myocardial Infarction, Myocardial Perfusion Study  History Comments: 1996 IWMI 2009 MPS No Ischemia  Symptoms: DOE, Palpitations    Nuclear Pre-Procedure Cardiac Risk Factors: Carotid Disease, RBBB Caffeine/Decaff Intake: None NPO After: 7:00 PM Lungs: clear IV 0.9% NS with Angio Cath: 22g     IV Site: R Hand IV Started by: Bonnita Levan, RN Chest Size (in) 44     Height (in): 72 Weight (lb): 194 BMI: 26.41  Nuclear Med Study 1 or 2 day study:  1 day     Stress Test Type:  Stress Reading MD:  Willa Rough, MD     Referring MD:  B.Crenshaw Resting Radionuclide:  Technetium 24m Tetrofosmin     Resting Radionuclide Dose:  11 mCi  Stress Radionuclide:  Technetium 59m Tetrofosmin     Stress Radionuclide Dose:  33 mCi   Stress Protocol Exercise Time (min):  7:31 min     Max HR:  134 bpm     Predicted Max HR:  144 bpm  Max Systolic BP: 171 mm Hg     Percent Max HR:  93.06 %     METS: 9.30 Rate Pressure Product:  87564    Stress Test Technologist:  Milana Na, EMT-P     Nuclear Technologist:  Domenic Polite, CNMT  Rest Procedure  Myocardial perfusion imaging was performed at rest 45 minutes following the intravenous administration of Technetium 50m Tetrofosmin.  Stress Procedure  The patient exercised for 5:30.  The patient stopped due to fatigue, sob, and chest tightness.  There were non specific ST-T wave changes and occ pvcs/VCuplets.  Technetium 47m Tetrofosmin was injected at peak exercise and myocardial perfusion imaging was performed after a brief delay.  QPS Raw Data Images:  Normal; no motion artifact; normal heart/lung ratio. Stress Images:  Decreased activity at base/mid of the lateral wall Rest Images:  Same as stress Subtraction (SDS):  No evidence of ischemia. Transient Ischemic Dilatation:  0.85  (Normal  <1.22)  Lung/Heart Ratio:  0.30  (Normal <0.45)  Quantitative Gated Spect Images QGS EDV:  93 ml QGS ESV:  38 ml QGS EF:  59 % QGS cine images:  Good motion   Findings Abnormal      Overall Impression  Exercise Capacity: Good exercise capacity. BP Response: Hypertensive blood pressure response. Clinical Symptoms: Chest tight ECG Impression: No significant ST segment change suggestive of ischemia. Overall Impression Comments: There is old scar at the base/mid lateral wall. There is no ischemia.  Appended Document: Cardiology Nuclear Testing ok  Appended Document: Cardiology Nuclear Testing pt aware of results

## 2010-11-13 NOTE — Assessment & Plan Note (Signed)
Summary: F1Y/DM   History of Present Illness: [name] is a pleasant gentleman who has a history of coronary artery disease status post coronary artery bypassing graft.  His last Myoview was performed on November 10, 2007.  His ejection fraction was preserved at 56%.  There is a small inferolateral infarct at the mid and basal level, but no ischemia was noted. Carotid Dopplers performed in January of 2010 revealed 0-39% bilateral stenosis and followup was recommended in 2 years. Abdominal ultrasound in March of 2007 showed no aneurysm.  I last saw him in August of 2010. Since then the patient has dyspnea with more extreme activities but not with routine activities. It is relieved with rest. It is not associated with chest pain. There is no orthopnea, PND or pedal edema. There is no syncope or palpitations. There is no exertional chest pain.   Current Medications (verified): 1)  Altace 5 Mg Caps (Ramipril) .... Take 1 Capsule By Mouth Once A Day 2)  Niaspan 1000 Mg Cr-Tabs (Niacin (Antihyperlipidemic)) .Marland Kitchen.. 1 Tablet By Mouth At Bedtime 3)  Pravastatin Sodium 40 Mg Tabs (Pravastatin Sodium) .... Take 1 Tablet By Mouth At Bedtime 4)  Multivitamins   Tabs (Multiple Vitamin) .Marland Kitchen.. 1 Tab By Mouth Once Daily 5)  Aspirin 81 Mg  Tabs (Aspirin) .Marland Kitchen.. 1 Tab By Mouth Once Daily 6)  Calcium 500 Mg Tabs (Calcium Carbonate) .Marland Kitchen.. 1 Tab By Mouth Once Daily 7)  Vitamin C 500 Mg Tabs (Ascorbic Acid) .Marland Kitchen.. 1 Tab By Mouth Once Daily  Allergies (verified): No Known Drug Allergies  Past History:  Past Medical History: CEREBROVASCULAR DISEASE (ICD-437.9) PREMATURE VENTRICULAR CONTRACTIONS (ICD-427.69) HYPERLIPIDEMIA (ICD-272.4) CAD (ICD-414.00)  Past Surgical History: Reviewed history from 05/28/2009 and no changes required. status post coronary artery bypassing graft. August 1996 with LIMA to the LAD and diagonal, saphenous vein graft to the intermediate, obtuse marginal and circumflex, and a saphenous vein graft  to the right coronary artery and PDA.  Social History: Reviewed history from 05/28/2009 and no changes required. Tobacco Use - No.  Alcohol Use - no  Review of Systems       no fevers or chills, productive cough, hemoptysis, dysphasia, odynophagia, melena, hematochezia, dysuria, hematuria, rash, seizure activity, orthopnea, PND, pedal edema, claudication. Remaining systems are negative.   Vital Signs:  Patient profile:   77 year old male Height:      72 inches Weight:      197 pounds BMI:     26.81 Pulse rate:   67 / minute Pulse rhythm:   regular BP sitting:   108 / 60  (left arm) Cuff size:   large  Vitals Entered By: Deliah Goody, RN (May 23, 2010 8:00 AM)  Physical Exam  General:  Well-developed well-nourished in no acute distress.  Skin is warm and dry.  HEENT is normal.  Neck is supple. No thyromegaly.  Chest is clear to auscultation with normal expansion.  Cardiovascular exam is regular rate and rhythm.  Abdominal exam nontender or distended. No masses palpated. Extremities show no edema. neuro grossly intact    EKG  Procedure date:  05/23/2010  Findings:      Sinus rhythm at a rate of 67. Right bundle branch block.  Impression & Recommendations:  Problem # 1:  CEREBROVASCULAR DISEASE (ICD-437.9) Continue aspirin and statin. Followup carotid Dopplers January 2012.  Problem # 2:  HYPERLIPIDEMIA (ICD-272.4)  Continue present medications. Check lipids and liver. Given ACE inhibitor use we'll also check potassium and renal function.  His updated medication list for this problem includes:    Niaspan 1000 Mg Cr-tabs (Niacin (antihyperlipidemic)) .Marland Kitchen... 1 tablet by mouth at bedtime    Pravastatin Sodium 40 Mg Tabs (Pravastatin sodium) .Marland Kitchen... Take 1 tablet by mouth at bedtime  His updated medication list for this problem includes:    Niaspan 1000 Mg Cr-tabs (Niacin (antihyperlipidemic)) .Marland Kitchen... 1 tablet by mouth at bedtime    Pravastatin Sodium 40 Mg Tabs  (Pravastatin sodium) .Marland Kitchen... Take 1 tablet by mouth at bedtime  Problem # 3:  CAD (ICD-414.00) Continue aspirin, ACE inhibitor and statin. Schedule myoview. His updated medication list for this problem includes:    Altace 5 Mg Caps (Ramipril) .Marland Kitchen... Take 1 capsule by mouth once a day    Aspirin 81 Mg Tabs (Aspirin) .Marland Kitchen... 1 tab by mouth once daily  Orders: Nuclear Stress Test (Nuc Stress Test)  Patient Instructions: 1)  Your physician recommends that you schedule a follow-up appointment in: ONE YEAR 2)  Your physician recommends that you return for lab work ZO:XWRU STESS TEST 3)  Your physician has requested that you have an exercise stress myoview.  For further information please visit [email or web address]  Please follow instruction sheet, as given.

## 2010-11-13 NOTE — Progress Notes (Signed)
Summary: pt needs a referral  Phone Note Call from Patient Call back at Home Phone [phone]   Caller: Patient Reason for Call: Talk to Nurse, Talk to Doctor Summary of Call: pt needs a referral to a internist due to pt had some trouble with his gallbladder  Initial call taken by: Omer Jack,  June 19, 2010 11:26 AM  Follow-up for Phone Call        spoke with pt, referral made to dr Sanda Linger 06-27-10 @ 2:45pm. Deliah Goody, RN  June 19, 2010 2:51 PM

## 2010-11-13 NOTE — Letter (Signed)
Summary: Results Follow-up Letter  [facility]  [address] [place], [place] [address]   Phone: [phone]  Fax: [phone]    07/01/2010  [address] [place], [place]  [address]  Dear Mr. Dubose,   The following are the results of your recent test(s):  Test     Result     Ultrasound     one gallstone and an abdominal aortic aneurysm   _________________________________________________________  Please call for an appointment soon _________________________________________________________ _________________________________________________________ _________________________________________________________  Sincerely,  Sanda Linger MD [facility]

## 2010-11-13 NOTE — Progress Notes (Signed)
Summary: refill meds  Phone Note Refill Request Call back at Home Phone [phone] Message from:  Patient on Feb 20, 2010 9:59 AM  rampril 5 mg [facility] [phone] for 90 days supply   Method Requested: Fax to [facility] Initial call taken by: Lorne Skeens,  Feb 20, 2010 10:00 AM    Prescriptions: ALTACE 5 MG CAPS (RAMIPRIL) Take 1 capsule by mouth once a day  #90 x 3   Entered by:   Kem Parkinson   Authorized by:   Ferman Hamming, MD, [facility]   Signed by:   Kem Parkinson on 02/20/2010   Method used:   Electronically to        [facility]* (mail-order)             ,          Ph: [phone]       Fax: [phone]   RxID:   [id number]

## 2010-11-13 NOTE — Progress Notes (Signed)
Summary: Pt request call  Phone Note Call from Patient Call back at Home Phone [phone]   Caller: Patient Reason for Call: Talk to Nurse Initial call taken by: Judie Grieve,  November 05, 2010 11:32 AM  Follow-up for Phone Call        Pt would like medications called  in to [facility] [phone]. Follow-up by: Lisabeth Devoid RN,  November 05, 2010 12:22 PM    Prescriptions: PRAVASTATIN SODIUM 40 MG TABS (PRAVASTATIN SODIUM) Take 1 tablet by mouth at bedtime  #90 x 3   Entered by:   Lisabeth Devoid RN   Authorized by:   Ferman Hamming, MD, [facility]   Signed by:   Lisabeth Devoid RN on 11/05/2010   Method used:   Electronically to        [facility]* (mail-order)       [address]       [place], [place]  [address]       Ph: [phone]       Fax: [phone]   RxID:   [id number] NIASPAN 1000 MG CR-TABS (NIACIN (ANTIHYPERLIPIDEMIC)) 1 tablet by mouth at bedtime  #90 x 3   Entered by:   Lisabeth Devoid RN   Authorized by:   Ferman Hamming, MD, [facility]   Signed by:   Lisabeth Devoid RN on 11/05/2010   Method used:   Electronically to        [facility]* (mail-order)       [address]       [place], [place]  [address]       Ph: [phone]       Fax: [phone]   RxID:   [id number] ALTACE 5 MG CAPS (RAMIPRIL) Take 1 capsule by mouth once a day  #90 x 3   Entered by:   Lisabeth Devoid RN   Authorized by:   Ferman Hamming, MD, [facility]   Signed by:   Lisabeth Devoid RN on 11/05/2010   Method used:   Electronically to        [facility]* (mail-order)       [address] [place]  [address]       Ph: [phone]       Fax: [phone]   RxID:   [id number]

## 2010-11-13 NOTE — Progress Notes (Signed)
Summary: refill  Phone Note Refill Request Message from:  Patient on September 17, 2010 8:08 AM  Refills Requested: Medication #1:  PRAVASTATIN SODIUM 40 MG TABS Take 1 tablet by mouth at bedtime [facility] [phone] need 90 day supply  Initial call taken by: Judie Grieve,  September 17, 2010 8:09 AM    Prescriptions: PRAVASTATIN SODIUM 40 MG TABS (PRAVASTATIN SODIUM) Take 1 tablet by mouth at bedtime  #90 x 3   Entered by:   Kem Parkinson   Authorized by:   Ferman Hamming, MD, [facility]   Signed by:   Kem Parkinson on 09/17/2010   Method used:   Electronically to        [facility]* (retail)             ,          Ph: [phone]       Fax: [phone]   RxID:   [id number]

## 2010-11-13 NOTE — Procedures (Addendum)
Summary: Colonoscopy  Patient: [name] Note: All result statuses are Final unless otherwise noted.  Tests: (1) Colonoscopy (COL)   COL Colonoscopy           DONE     [facility]     [address]     [place], [place]  [address]           COLONOSCOPY PROCEDURE REPORT     PATIENT:  [name], [name]  MR#:  [id number]     BIRTHDATE:  [birth year], 78 yrs. old  GENDER:  male     ENDOSCOPIST:  Judie Petit T. Russella Dar, MD, [facility]     Referred by:  Etta Grandchild, M.D.     PROCEDURE DATE:  11/06/2010     PROCEDURE:  Colonoscopy with snare polypectomy     ASA CLASS:  Class II     INDICATIONS:  1) Routine Risk Screening     MEDICATIONS:   Fentanyl 50 mcg IV, Versed 7 mg IV     DESCRIPTION OF PROCEDURE:   After the risks benefits and     alternatives of the procedure were thoroughly explained, informed     consent was obtained.  Digital rectal exam was performed and     revealed no abnormalities.   The LB [id number] endoscope     was introduced through the anus and advanced to the cecum, which     was identified by both the appendix and ileocecal valve, limited     by a tortuous and redundant colon, looping difficuties.    The     quality of the prep was good, using MoviPrep.  The instrument was     then slowly withdrawn as the colon was fully examined.           <<PROCEDUREIMAGES>>           FINDINGS:  A sessile polyp was found in the cecum. It was 5 mm in     size. Polyp was snared, then cauterized with monopolar cautery.     Retrieval was successful. Mild diverticulosis was found in the     descending to distat transverse colon. A normal appearing     ileocecal valve, and appendiceal orifice were identified. The     ascending, hepatic flexure, and rectum appeared unremarkable.     Retroflexed views in the rectum revealed internal hemorrhoids,     small. The time to cecum =  12.75  minutes. The scope was then     withdrawn (time =  18.5  min) from the patient and the  procedure     completed.           COMPLICATIONS:  None           ENDOSCOPIC IMPRESSION:     1) 5 mm sessile polyp in the cecum     2) Mild diverticulosis in the descending to transverse colon     3) Internal hemorrhoids           RECOMMENDATIONS:     1) Await pathology results     2) Hold aspirin, aspirin products, and anti-inflamatory     medication for 2 weeks.     3) High fiber diet with liberal fluid intake.     4) If the polyp is adenomatous (pre-cancerous), colonoscopy in 5     years. Otherwise no plans for future screening colonoscopes due to     age.           Judie Petit  Dellie Burns, MD, [facility]           n.     eSIGNED:   Venita Lick. Shalondra Wunschel at 11/06/2010 11:39 AM           Paulla Dolly, [id number]  Note: An exclamation mark (!) indicates a result that was not dispersed into the flowsheet. Document Creation Date: 11/06/2010 11:39 AM _______________________________________________________________________  (1) Order result status: Final Collection or observation date-time: 11/06/2010 11:30 Requested date-time:  Receipt date-time:  Reported date-time:  Referring Physician:   Ordering Physician: Claudette Head [phone]) Specimen Source:  Source: Launa Grill Order Number: [id number] Lab site:

## 2010-11-13 NOTE — Progress Notes (Signed)
Summary: refill meds  Phone Note Refill Request Call back at Home Phone [phone] Message from:  Patient on January 09, 2010 11:38 AM  Refills Requested: Medication #1:  NIASPAN 1000 MG CR-TABS 1 tablet by mouth at bedtime [facility] [phone]   Method Requested: Fax to [facility] Initial call taken by: Lorne Skeens,  January 09, 2010 11:38 AM    Prescriptions: NIASPAN 1000 MG CR-TABS (NIACIN (ANTIHYPERLIPIDEMIC)) 1 tablet by mouth at bedtime  #90 x 3   Entered by:   Kem Parkinson   Authorized by:   Ferman Hamming, MD, [facility]   Signed by:   Kem Parkinson on 01/09/2010   Method used:   Electronically to        [facility]* (mail-order)             ,          Ph: [phone]       Fax: [phone]   RxID:   [id number]

## 2010-11-13 NOTE — Consult Note (Signed)
Summary: [facility]  VVS - New Patient Consultation   Imported By: Marylou Mccoy 07/29/2010 07:27:39  _____________________________________________________________________  External Attachment:    Type:   Image     Comment:   External Document

## 2010-11-13 NOTE — Letter (Signed)
Summary: Pre Visit Letter Revised  [facility]  [address] [place], [place] [address]   Phone: [phone]  Fax: [phone]        09/23/2010 MRN: [id number]  [facility] [address] [place], [place]  [address]             Procedure Date:  11-06-10 10:30am           Dr Russella Dar   Welcome to the Gastroenterology Division at [facility].    You are scheduled to see a nurse for your pre-procedure visit on 10-23-10 at 8:30am on the 3rd floor at [facility], [address]  We ask that you try to arrive at our office 15 minutes prior to your appointment time to allow for check-in.  Please take a minute to review the attached form.  If you answer "Yes" to one or more of the questions on the first page, we ask that you call the person listed at your earliest opportunity.  If you answer "No" to all of the questions, please complete the rest of the form and bring it to your appointment.    Your nurse visit will consist of discussing your medical and surgical history, your immediate family medical history, and your medications.   If you are unable to list all of your medications on the form, please bring the medication bottles to your appointment and we will list them.  We will need to be aware of both prescribed and over the counter drugs.  We will need to know exact dosage information as well.    Please be prepared to read and sign documents such as consent forms, a financial agreement, and acknowledgement forms.  If necessary, and with your consent, a friend or relative is welcome to sit-in on the nurse visit with you.  Please bring your insurance card so that we may make a copy of it.  If your insurance requires a referral to see a specialist, please bring your referral form from your primary care physician.  No co-pay is required for this nurse visit.     If you cannot keep your appointment, please call [phone] to cancel or reschedule prior to your  appointment date.  This allows [place] the opportunity to schedule an appointment for another patient in need of care.    Thank you for choosing [facility] for your medical needs.  We appreciate the opportunity to care for you.  Please visit [place] at our website  to learn more about our practice.  Sincerely, The Gastroenterology Division

## 2010-11-19 NOTE — Letter (Signed)
Summary: Patient Notice- Colon Biospy Results  [facility]  [address] [place], [place] [address]   Phone: [phone]  Fax: [phone]        November 11, 2010 MRN: [id number]    [facility] [address] [place], [place]  [address]    Dear [name],  I am pleased to inform you that the biopsies taken during your recent colonoscopy did not show any evidence of cancer upon pathologic examination. The biopsies showed only lymphoid tissue. There was no polyp.  Continue with the treatment plan as outlined on the day of your      exam.  Please call us if you are having persistent problems or have questions about your condition that have not been fully answered at this time.  Sincerely,  Meryl Dare MD [facility]  This letter has been electronically signed by your physician.  Appended Document: Patient Notice- Colon Biospy Results LETTER MAILED

## 2010-12-24 LAB — COMPREHENSIVE METABOLIC PANEL
ALT: 16 U/L (ref 0–53)
AST: 28 U/L (ref 0–37)
Alkaline Phosphatase: 56 U/L (ref 39–117)
Creatinine, Ser: 1.21 mg/dL (ref 0.4–1.5)
GFR calc non Af Amer: 58 mL/min — ABNORMAL LOW (ref >60)
Potassium: 4.5 mEq/L (ref 3.5–5.1)
Total Bilirubin: 0.8 mg/dL (ref 0.3–1.2)

## 2010-12-24 LAB — CBC
HCT: 45.7 % (ref 39.0–52.0)
MCHC: 33.9 g/dL (ref 30.0–36.0)
MCV: 97 fL (ref 78.0–100.0)
Platelets: 124 10*3/uL — ABNORMAL LOW (ref 150–400)
RDW: 12.9 % (ref 11.5–15.5)
WBC: 5.6 10*3/uL (ref 4.0–10.5)

## 2010-12-24 LAB — DIFFERENTIAL
Basophils Absolute: 0 10*3/uL (ref 0.0–0.1)
Basophils Relative: 1 % (ref 0–1)
Eosinophils Absolute: 0.1 10*3/uL (ref 0.0–0.7)
Eosinophils Relative: 2 % (ref 0–5)

## 2010-12-24 LAB — SURGICAL PCR SCREEN: Staphylococcus aureus: POSITIVE — AB

## 2011-01-20 ENCOUNTER — Ambulatory Visit: Payer: MEDICARE | Admitting: Internal Medicine

## 2011-02-24 NOTE — Assessment & Plan Note (Signed)
[facility]                            CARDIOLOGY OFFICE NOTE   NAME:[name], [name]                      MRN:          [id number]  DATE:06/05/2008                            DOB:          1932/06/26    Mr. Needle is a pleasant gentleman who has a history of coronary artery  disease status post coronary artery bypassing graft.  His last Myoview  was performed on November 10, 2007.  His ejection fraction was preserved  at 56%.  There is a small inferolateral infarct at the mid and basal  level, but no ischemia was noted.  He was having problems with a skip  in his pulse backed in.  Since that time, he has not had significant  dyspnea unless he goes very hard.  He has not had chest pain,  palpitations, or syncope, and there is no pedal edema.  He occasionally  feels his pulse that is irregular, but only when he checks his pulse.  He does not have palpitations.   MEDICATIONS:  1. Multivitamin.  2. Baby aspirin 81 mg p.o. daily.  3. Vitamin C.  4. Altace 5 mg p.o. daily.  5. Pravachol 40 mg p.o. daily.  6. Niaspan 1 g p.o. daily.  7. Calcium.   PHYSICAL EXAMINATION:  VITAL SIGNS:  Shows a blood pressure of 107/70,  his pulse is 62, he weighs 196 pounds.  HEENT:  Normal.  NECK:  Supple.  There is a soft right carotid bruit.  CHEST:  Clear.  CARDIOVASCULAR:  Regular rate and rhythm.  ABDOMEN:  Shows no tenderness.  EXTREMITIES:  Show, no edema.   His electrocardiogram shows sinus rhythm at a rate of 62.  There is a  right bundle-branch block and a left axis deviation is noted.   DIAGNOSES:  1. Coronary artery disease status post coronary artery bypassing graft      - Mr. Nettle is doing well with no chest pain or increased shortness      of breath.  His recent Myoview was low risk.  We will continue with      medical therapy to include his aspirin, ACE inhibitor, and statin.  2. History of premature ventricular contractions - He has normal left     ventricular function, and he does not have palpitations.  We will      not pursue this further.  3. Hyperlipidemia - He will continue on statin and Niaspan.  He will      need followup lipids and liver in January when he returns for his      carotid Dopplers.  4. History of mild cerebrovascular disease - He will need followup      carotid Dopplers in January 2010.   We discussed importance of diet and exercise.  I will see him back in 1  year.     Madolyn Frieze Jens Som, MD, [facility]  Electronically Signed    BSC/MedQ  DD: 06/05/2008  DT: 06/05/2008  Job #: [id number]

## 2011-02-24 NOTE — Assessment & Plan Note (Signed)
[facility]                            CARDIOLOGY OFFICE NOTE   FILLMORE, BYNUM                      MRN:          [id number]  DATE:11/24/2007                            DOB:          06-May-1932    [name] is a very pleasant 77 year old gentleman has a history of  coronary disease status post bypass graft.  I last saw him on November 04, 2007 and he was concerned about his heartbeat been irregular when  he checked his pulse.  Note, we did perform a Myoview at that time  which showed an ejection fraction that was preserved at 56%.  There is a  small prior inferolateral infarct at the mid basal level but there is no  ischemia.  Since then he is doing really well.  There is some fatigue  but there is no dyspnea on exertion, orthopnea, PND, pedal edema,  palpitations, presyncope, syncope or chest pain.  He does occasionally  still feel his pulse skip but only when he checks his pulse.  Also no we  did check laboratories on November 10, 2007 his potassium is 4.4 and his  magnesium was 2.2.  His HDL was low at 32.5.  We added Niaspan.  His  present medications include a multivitamin daily, baby aspirin daily,  vitamin C, Altace 5 mg daily, Pravachol 4 mg daily, Niaspan 5 mg daily,  calcium.   PHYSICAL EXAM:  Today shows a blood pressure of 100/80 and his pulse of  60.  Weighs 196 pounds.  HEENT:  Normal.  His neck is supple.  CHEST:  Clear.  CARDIOVASCULAR:  Regular rate and rhythm.  ABDOMEN:  Exam shows no tenderness.  EXTREMITIES:  Show no edema.   DIAGNOSES:  1. Premature ventricular contractions - [name] is having PVCs but      his LV function is normal and there is no ischemia on his Myoview.      Electrolytes are normal.  He does not feel these other than when he      checks his pulse.  We will therefore not treat at present.  If it      becomes symptomatic, we consider adding low dose beta blockade.  2. Coronary disease status post  bypass graft - he will continue his      aspirin, ACE inhibitor and statin.  3. Hyperlipidemia - he will continue on statin.  We recently add      Niaspan for low HDL.  We will plan to repeat his lipids and liver      as scheduled.  4. History of mild cerebrovascular disease - he will need follow-up      carotid Dopplers in January 2010.   We will see him back in 6 months of.  Continued diet, exercise.  Note he  does not smoke.     Madolyn Frieze Darren Som, MD, [facility]  Electronically Signed    BSC/MedQ  DD: 11/24/2007  DT: 11/25/2007  Job #: [id number]

## 2011-02-24 NOTE — Assessment & Plan Note (Signed)
[facility]                            CARDIOLOGY OFFICE NOTE   NAME:[name], [name]                      MRN:          [id number]  DATE:11/04/2007                            DOB:          1932/05/26    [name] is a very pleasant gentleman who has a history of coronary  disease status post coronary bypassing graft.  His surgery was performed  in August 1996.  His most recent Myoview was performed on December 03, 2006.  His ejection fraction was 59%.  There was an  inferolateral/inferior basal scar but no ischemia.  Since I last saw him  he has done reasonably well.  However, recently he has been checking his  pulse at the gym and he has noticed that he has been irregular.  He  feels it skip.  He has not had palpitations with this but only realizes  it with checking his pulse.  He also has had some dyspnea on exertion  which is unusual for him.  There is no orthopnea, PND, pedal edema,  presyncope, syncope or chest pain.   MEDICATIONS:  1. Multivitamin.  2. Baby aspirin 81 mg p.o. daily.  3. Vitamin C.  4. Altace 5 mg p.o. daily.  5. Pravachol 40 mg p.o. daily.   PHYSICAL EXAM:  Shows a blood pressure of 115/70 and his pulse is 62.  He weighs 197 pounds.  HEENT:  Normal.  NECK:  Supple.  CHEST:  Clear.  CARDIOVASCULAR:  Reveals a regular rate and rhythm.  ABDOMINAL:  Shows no tenderness.  EXTREMITIES:  Show no edema.   Electrocardiogram shows a sinus rhythm with a rare PVC.  There is a  right bundle branch block and a left anterior fascicular block is noted.   DIAGNOSES:  1. Premature ventricular contractions -- Mr. Abraha has palpating      premature ventricular contractions when he checks his pulse and it      is evident on his electrocardiogram.  I have explained that in the      setting of normal left ventricular function this is benign.      However, he is complaining of mild dyspnea on exertion as well and      we will schedule him  to have a Myoview to exclude ischemia and also      to reassess his left ventricular function.  We will also check a      magnesium and a BMET to follow his potassium and renal function.  I      think if the above is normal then we would not need to pursue this      further.  If this becomes symptomatic then we could consider      discontinuing his Altace and adding low dose beta blockade.  2. Coronary disease status post coronary bypassing graft -- He will      continue on his aspirin, Altace and statin.  3. Hyperlipidemia -- We will check lipids and liver and adjust with a      goal LDL of less than 70.  4. Mild  cerebrovascular disease in the past -- He will need follow-up      carotid Dopplers in January 2010.   I will see him back in February as scheduled and we will review his  Myoview and his symptoms.     Madolyn Frieze Jens Som, MD, [facility]  Electronically Signed    BSC/MedQ  DD: 11/04/2007  DT: 11/05/2007  Job #: [id number]

## 2011-02-24 NOTE — Consult Note (Signed)
NEW PATIENT CONSULTATION   [name], [name]  DOB:  Aug 12, 1932                                       07/15/2010  ZOXWR#:[id number]   The patient is a 77 year old male patient referred by Dr. Yetta Barre for  infrarenal abdominal aortic aneurysm recently discovered on an  ultrasound study.  This patient developed some right upper quadrant  discomfort while at [facility] in the last few weeks.  He had a CT  scan which revealed a single gallstone in the gallbladder.  His symptoms  lasted for about 5 hours and then resolved.  He had an ultrasound  performed when he returned to be seen by Dr. Yetta Barre, his medical doctor,  and this revealed a small abdominal aortic aneurysm measuring 4.1 x 3.6  cm as well as gallstones.  I have reviewed Dr. Sanda Linger' clinical  records which he supplied to [place] today as well as the ultrasound report.  A CT scan performed at [facility] we do not have.   CHRONIC MEDICAL PROBLEMS:  1. Coronary artery disease, previous coronary bypass grafting in 1996      by Dr. Andrey Campanile.  2. Cholelithiasis.  3. Hyperlipidemia.  4. GERD.  5. Hypertension.  6. Hiatal hernia.   SOCIAL HISTORY:  The patient is widowed, has 3 children, is retired.  He  does not use tobacco or alcohol.   FAMILY HISTORY:  Negative for coronary artery disease, diabetes or  stroke.   REVIEW OF SYSTEMS:  Negative for chest pain.  Does have occasional  dyspnea on exertion, no orthopnea or palpitations.  Denies any pulmonary  symptoms such as hemoptysis, productive cough, bronchitis.  Does have  occasional reflux esophagitis with heartburn secondary to his hiatal  hernia.  All other systems negative on review of systems.   PHYSICAL EXAMINATION:  Vital signs:  Blood pressure was 104/63, heart  rate 62, respirations 14.  General:  He is a well-developed, well-  nourished male who is in no apparent distress, alert and oriented x3.  HEENT:  Exam is normal for age.  EOMs intact.   Lungs:  Clear to  auscultation.  No rhonchi or wheezing.  Cardiovascular:  Regular rhythm.  No murmurs.  Carotid pulses are 3+.  No audible bruits.  Abdomen:  Soft,  nontender.  There is a small pulsatile mass approximating 4 cm in mid-  epigastrium.  Musculoskeletal:  Exam is free of major deformities.  Neurologic:  Normal.  Skin:  Free of rashes.  Extremities:  Lower  extremity exam has 3+ femoral, popliteal, and dorsalis pedis pulses with  no edema.   I discussed with him the current treatment modalities for aortic  aneurysm including open repair and stent grafts and the fact that he  does not require any treatment at this time because of the size the  aneurysm.  We will see him back in 9 months.  I have ordered a CT  angiogram to be done at that time so we can determine whether he would  be a stent graft candidate if his aneurysm enlarges.  We will then  follow him on a regular basis in our office.     Quita Skye Hart [place], M.D.  Electronically Signed   JDL/MEDQ  D:  07/15/2010  T:  07/15/2010  Job:  [id number]   cc:   Sanda Linger, MD  Madolyn Frieze Jens Som, MD, [facility]

## 2011-02-27 NOTE — Assessment & Plan Note (Signed)
[facility]                            CARDIOLOGY OFFICE NOTE   NAME:[name], [name]                      MRN:          [id number]  DATE:11/19/2006                            DOB:          04/25/1932    Mr. Schnepf is a pleasant gentleman who has a history of coronary disease  status post coronary artery bypassing graft.  Since I last saw him, he  is doing well.  There is mild dyspnea with more moderate activities but  not with routine activities.  There is no orthopnea, PND, pedal edema,  palpitations, presyncope, syncope or chest pain.  He is exercising  routinely and trying to follow a diet.  He does not smoke.   His medications include:  1. Multivitamin.  2. Baby aspirin 81 mg daily.  3. Vitamin C.  4. Altace 5 mg p.o. daily.  5. Pravachol 40 mg q.h.s.   PHYSICAL EXAMINATION TODAY:  VITAL SIGNS:  Shows a blood pressure 116/62  and his pulse is 64.  His weight is 197.  NECK:  Supple.  CHEST:  Clear.  CARDIOVASCULAR:  Reveals a regular rate and rhythm.  EXTREMITIES:  Show no edema.   Electrocardiogram shows sinus rhythm at a rate of 64.  There is a right  bundle-branch block.  Prior inferior infarct cannot be excluded.   DIAGNOSES:  1. Coronary artery disease status post coronary artery bypassing      graft.  2. Prior inferior infarct.   PLAN:  Mr. Merrow is doing well from a symptomatic standpoint.  We will  continue with his present medications.  We will schedule him to have a  stress Myoview as it has been 4 years since his previous.  He is also  having mild dyspnea at times.  We will check lipids and liver and adjust  his Pravachol as indicated and we will also check a BMET, given his  Altace use.  We will continue with risk factor modification and he will  see [place] back in 12 months.     Madolyn Frieze Jens Som, MD, [facility]  Electronically Signed    BSC/MedQ  DD: 11/19/2006  DT: 11/19/2006  Job #: [id number]

## 2011-02-27 NOTE — Op Note (Signed)
NAME:  [name], [name] NO.:  [id number]   MEDICAL RECORD NO.:  [id number]          PATIENT TYPE:  AMB   LOCATION:  SDS                          FACILITY:  [facility]   PHYSICIAN:  Guadelupe Sabin, M.D.DATE OF BIRTH:  07-Aug-1932   DATE OF PROCEDURE:  06/19/2005  DATE OF DISCHARGE:                                 OPERATIVE REPORT   PREOPERATIVE DIAGNOSIS:  Senile nuclear cataract right eye.   POSTOPERATIVE DIAGNOSIS:  Senile nuclear cataract right eye.   DATE OF OPERATION:  Planned extracapsular cataract extraction -  phacoemulsification, primary insertion of posterior chamber intraocular lens  implant.   SURGEON:  Jacklin.   ASSISTANT:  Nurse.   ANESTHESIA:  Local 4% Xylocaine, 0.75 Marcaine retrobulbar block, topical  tetracaine, intraocular Xylocaine; anesthesia standby required in this  cardiac patient. Patient given sodium Pentothal intravenously during the  period of retrobulbar blocking.   OPERATIVE PROCEDURE:  After the patient was prepped and draped, a lid  speculum was inserted in the right eye. Schiotz tonometry was recorded at  five scale units with a 5.5 grams weight. A peritomy was performed adjacent  to the limbus from the 11 to the 1 o'clock position. The corneoscleral  junction was cleaned and a corneoscleral groove made with a 45 degrees  Superblade.  The anterior chamber was then entered with the 2.5 mm diamond  keratome at the 12 o'clock position and a 15 degrees blade at the 2:30  position using a bent 26 gauge needle on an OcuCoat syringe, a circular  capsulorrhexis was begun and then completed with the Grabow forceps.  Hydrodissection and hydrodelineation were performed using 1% Xylocaine. The  30 degree phacoemulsification tip was then inserted with slow controlled  emulsification of the lens nucleus.  Total ultrasonic time 50 seconds,  average power level 19%, total amount of fluid 50 mL.  Following removal of  the nucleus, the residual  cortex was gradually aspirated. There appeared to  be a small oval break in the posterior capsule but there was no vitreous  presentation or vitreous loss. The small amount of residual cortex was then  left in the periphery feeling that further manipulation might cause vitreous  presentation and loss and more difficulty inserting the intraocular lens  implant. An [employer] SI40NB +21 diopter lens was then  inserted into the ciliary sulcus.  The lens was rotated slightly and  appeared to be in satisfactory position. The OcuCoat and Provisc which had  been used as a viscoelastic were aspirated and replaced with balanced salt  solution and Miochol ophthalmic solution. The pupil was round and there did  not appear to be any vitreous incarceration in the cataract incision.  It  was elected to close the incision with a 10-0 interrupted nylon suture at  the 12 o'clock position. The patient was given Diamox 500 milligrams  intravenously to  continue the lowering of intraocular pressure. Maxitrol ointment was  instilled in the conjunctival cul-de-sac and a light patch and protector  shield applied.  Duration of procedure and anesthesia administration 45  minutes. The patient tolerated the  procedure well in general, left the  operating room for the recovery room in good condition.           ______________________________  Guadelupe Sabin, M.D.     HNJ/MEDQ  D:  06/19/2005  T:  06/19/2005  Job:  [id number]

## 2011-02-27 NOTE — H&P (Signed)
NAME:  [name], [name] NO.:  [id number]   MEDICAL RECORD NO.:  [id number]          PATIENT TYPE:  AMB   LOCATION:  SDS                          FACILITY:  [facility]   PHYSICIAN:  Guadelupe Sabin, M.D.DATE OF BIRTH:  1932-03-28   DATE OF ADMISSION:  06/19/2005  DATE OF DISCHARGE:                                HISTORY & PHYSICAL   REASON FOR ADMISSION:  This was a planned outpatient surgical admission of  this 77 year old, white male admitted for cataract implant surgery of the  right eye.   HISTORY OF PRESENT ILLNESS:  This the patient has gradually noted the  progressive loss of vision in his right eye due to cataract formation.  He  was first seen in my office in July 2000.  At that time, early nuclear  cataract formation was noted in both eyes.  Over the recent years, however,  vision has deteriorated to 20/60 minus and the patient has now elected to  proceed with cataract implant surgery.  He was given oral discussion and  printed information concerning the procedure and its possible complications.  He signed an informed consent and arrangements were made for his outpatient  admission at this time.   PAST MEDICAL HISTORY:  The patient is in satisfactory condition under the  care of his regular physician, Dr. Abigail Miyamoto and his cardiologist, Dr.  Jens Som.   CURRENT MEDICATIONS:  1.  Altace.  2.  Aspirin 81 mg.  3.  Zocor.  4.  Vitamin C.   PAST MEDICAL HISTORY:  Coronary artery disease with previous coronary artery  bypass surgery.   REVIEW OF SYSTEMS:  No current cardiorespiratory complaints.   PHYSICAL EXAMINATION:  VITAL SIGNS:  As recorded on admission, blood  pressure 103/62, pulse 49, respirations 18, temperature 97.2.  GENERAL:  The patient is a pleasant, well-nourished, well-developed, 73-year-  old, white male in no acute distress.  HEENT:  EYES:  Visual acuity as recorded above, right eye 20/60 minus, left  eye 20/20.  Applanation tonometry at  14 mm right eye, 19 left eye.  Slit  lamp examination with nuclear cataract formation greater in right than left  eye.  Dilated detailed fundus examination shows cataract haze greater in the  right than left eye.  The vitreous is clear.  Retina attached with normal  optic nerve blood vessels and macula.  CHEST:  Lungs clear to percussion and auscultation.  HEART:  Normal sinus rhythm, no cardiomegaly.  No murmurs.  ABDOMEN:  Negative.  EXTREMITIES:  Negative.   ADMISSION DIAGNOSIS:  Senile nuclear cataract both eyes, right eye greater  than left.   PLAN:  Cataract implant surgery right eye now, left eye later as needed.           ______________________________  Guadelupe Sabin, M.D.     HNJ/MEDQ  D:  06/19/2005  T:  06/19/2005  Job:  [id number]   cc:   Chales Salmon. Abigail Miyamoto, M.D.  [address]  [place]  [place] [address]  Fax: [phone]   Olga Millers, M.D. [facility]  [address]  [address]  [place]  [place] [address]

## 2011-03-05 ENCOUNTER — Other Ambulatory Visit: Payer: Self-pay | Admitting: Vascular Surgery

## 2011-03-05 DIAGNOSIS — I714 Abdominal aortic aneurysm, without rupture: Secondary | ICD-10-CM

## 2011-04-06 ENCOUNTER — Encounter: Payer: Self-pay | Admitting: Cardiology

## 2011-04-28 ENCOUNTER — Ambulatory Visit (INDEPENDENT_AMBULATORY_CARE_PROVIDER_SITE_OTHER): Payer: Medicare Other | Admitting: Vascular Surgery

## 2011-04-28 ENCOUNTER — Ambulatory Visit
Admission: RE | Admit: 2011-04-28 | Discharge: 2011-04-28 | Disposition: A | Discharge status: Home / Self Care | Payer: Medicare Other | Source: Ambulatory Visit | Attending: Vascular Surgery | Admitting: Vascular Surgery

## 2011-04-28 DIAGNOSIS — I714 Abdominal aortic aneurysm, without rupture: Secondary | ICD-10-CM

## 2011-04-28 MED ORDER — IOHEXOL 350 MG/ML SOLN
100.0000 mL | Freq: Once | INTRAVENOUS | Status: AC | PRN
  Administered 2011-04-28: 100 mL via INTRAVENOUS

## 2011-04-29 NOTE — Assessment & Plan Note (Signed)
OFFICE VISIT  Darren, Hughes DOB:  [birth year]                                       04/28/2011 ZOXWR#:[id number]  Patient returns today for follow-up regarding his infrarenal abdominal aortic aneurysm.  This gentleman was seen by me in October 2011, having had an episode of right upper quadrant pain in [facility].  He had an ultrasound study performed after returning to [facility], which revealed a 4.1 x 3.6 cm abdominal aortic aneurysm.  He also had a gallstone and has subsequently undergone cholecystectomy.  Today I had a CT angiogram performed to look at the anatomy and see the exact size and determine if he was feasible for stent grafting if that became necessary.  Today I reviewed the CT angiogram by computer, and in fact there is no abdominal aortic aneurysm, maximum diameter being 2.1 cm with widely patent infrarenal aorta with some calcific changes.  CHRONIC MEDICAL PROBLEMS: 1. Coronary artery disease with previous coronary bypass grafting in     1996. 2. Cholelithiasis. 3. Hyperlipidemia. 4. GERD. 5. Hypertension. 6. History of hiatal hernia.  SOCIAL HISTORY:  Patient is widowed, has 3 children.  Is retired.  Does not use tobacco or alcohol.  FAMILY HISTORY:  Negative for coronary artery disease, diabetes, and stroke.  REVIEW OF SYSTEMS:  Continues to deny chest pain, claudication, TIAs, amaurosis fugax, diplopia, blurred vision, syncope, or any other symptoms, including abdominal and back pain.  PHYSICAL EXAMINATION:  Blood pressure 135/64, heart rate 58, respirations 20.  General:  He is a well-developed and well-nourished male in no apparent distress, alert and oriented x3.  HEENT:  Normal for age.  EOMs intact.  Lungs:  Clear to auscultation.  No rhonchi or wheezing.  Cardiovascular:  Regular rhythm.  No murmurs.  Carotid pulses 3+.  No bruits.  Abdomen:  Soft, nontender.  No pulsatile  masses appreciated today.  Musculoskeletal:  Free of major deformities. Neurologic:  Normal.  Lower extremity exam reveals 3+ femoral and posterior tibial pulses bilaterally.  I have explained to the patient that he does not have an abdominal aortic aneurysm which has been confirmed by the CT scan today.  He will not need further follow-up in our office and could be happy to see him on an as-needed basis.    Quita Skye Hart [place], M.D. Electronically Signed  JDL/MEDQ  D:  04/28/2011  T:  04/29/2011  Job:  [id number]

## 2011-05-12 ENCOUNTER — Encounter: Payer: Self-pay | Admitting: Cardiology

## 2011-05-19 ENCOUNTER — Encounter: Payer: Self-pay | Admitting: Cardiology

## 2011-05-19 ENCOUNTER — Ambulatory Visit (INDEPENDENT_AMBULATORY_CARE_PROVIDER_SITE_OTHER): Payer: Medicare Other | Admitting: Cardiology

## 2011-05-19 DIAGNOSIS — E785 Hyperlipidemia, unspecified: Secondary | ICD-10-CM

## 2011-05-19 DIAGNOSIS — I10 Essential (primary) hypertension: Secondary | ICD-10-CM

## 2011-05-19 DIAGNOSIS — I2510 Atherosclerotic heart disease of native coronary artery without angina pectoris: Secondary | ICD-10-CM

## 2011-05-19 DIAGNOSIS — E7800 Pure hypercholesterolemia, unspecified: Secondary | ICD-10-CM

## 2011-05-19 DIAGNOSIS — Z79899 Other long term (current) drug therapy: Secondary | ICD-10-CM

## 2011-05-19 DIAGNOSIS — I679 Cerebrovascular disease, unspecified: Secondary | ICD-10-CM

## 2011-05-19 NOTE — Assessment & Plan Note (Signed)
Continue aspirin, ACE inhibitor and statin. Continue risk factor modification. Last Myoview low risk.

## 2011-05-19 NOTE — Assessment & Plan Note (Signed)
Continue ACE inhibitor. Check potassium and renal function.

## 2011-05-19 NOTE — Progress Notes (Signed)
HPI:[name] is a pleasant gentleman who has a history of coronary artery disease status post coronary artery bypassing graft.  His last Myoview was performed in August of 2011.  His ejection fraction was preserved at 58%.  There is a prior lateral infarct at the mid and basal level, but no ischemia was noted. 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 FU CT showed no aneurysm. Carotid Dopplers performed in January of 2012 revealed 0-39% bilateral stenosis. I last saw him in August of 2011. Since then the patient has dyspnea with more extreme activities but not with routine activities. It is relieved with rest. It is not associated with chest pain. There is no orthopnea, PND or pedal edema. There is no syncope or palpitations. There is no exertional chest pain.  Current Outpatient Prescriptions  Medication Sig Dispense Refill  . aspirin 81 MG tablet Take 81 mg by mouth daily.        . Calcium 600-200 MG-UNIT per tablet Take 1 tablet by mouth daily.        . multivitamin (THERAGRAN) per tablet Take 1 tablet by mouth daily.        . niacin (NIASPAN) 1000 MG CR tablet Take 1,000 mg by mouth at bedtime.        . pravastatin (PRAVACHOL) 40 MG tablet Take 40 mg by mouth at bedtime.        . ramipril (ALTACE) 5 MG capsule Take 5 mg by mouth daily.        . vitamin C (ASCORBIC ACID) 500 MG tablet Take 500 mg by mouth daily.           Past Medical History  Diagnosis Date  . Cerebrovascular disease   . Premature ventricular contractions   . Hyperlipidemia   . Coronary artery disease   . Hx of colonic polyps   . GERD (gastroesophageal reflux disease)   . Hypertension   . PSA elevation 02/20/10    PSA=1.7 with Dr. Earlene Plater    Past Surgical History  Procedure Date  . Coronary artery bypass graft August 1996    LIMA to the LAD and diagonal, saphenous vein graft to the intermediate, obtuse marginale and circumflex, and a SVG to the RCA and PDA  . Appendectomy   . Tonsillectomy     . Cholecystectomy Nov 2011    History   Social History  . Marital Status: Widowed    Spouse Name: N/A    Number of Children: N/A  . Years of Education: N/A   Occupational History  . Retired    Social History Main Topics  . Smoking status: Never Smoker   . Smokeless tobacco: Not on file  . Alcohol Use: No  . Drug Use:   . Sexually Active:    Other Topics Concern  . Not on file   Social History Narrative   Gets regular exercise.    ROS: no fevers or chills, productive cough, hemoptysis, dysphasia, odynophagia, melena, hematochezia, dysuria, hematuria, rash, seizure activity, orthopnea, PND, pedal edema, claudication. Remaining systems are negative.  Physical Exam: Well-developed well-nourished in no acute distress.  Skin is warm and dry.  HEENT is normal.  Neck is supple. No thyromegaly.  Chest is clear to auscultation with normal expansion. Previous sternotomy Cardiovascular exam is regular rate and rhythm.  Abdominal exam nontender or distended. No masses palpated. Extremities show no edema. neuro grossly intact  ECG NSR, RBBB, LAFB

## 2011-05-19 NOTE — Assessment & Plan Note (Signed)
Continue statin. Check lipids and liver. 

## 2011-05-19 NOTE — Patient Instructions (Signed)
Your physician wants you to follow-up in: one year You will receive a reminder letter in the mail two months in advance. If you don't receive a letter, please call our office to schedule the follow-up appointment.  

## 2011-05-19 NOTE — Assessment & Plan Note (Signed)
Continue aspirin and statin. 

## 2011-05-21 ENCOUNTER — Other Ambulatory Visit (INDEPENDENT_AMBULATORY_CARE_PROVIDER_SITE_OTHER): Payer: Medicare Other

## 2011-05-21 DIAGNOSIS — I2510 Atherosclerotic heart disease of native coronary artery without angina pectoris: Secondary | ICD-10-CM

## 2011-05-21 DIAGNOSIS — E7800 Pure hypercholesterolemia, unspecified: Secondary | ICD-10-CM

## 2011-05-21 DIAGNOSIS — Z79899 Other long term (current) drug therapy: Secondary | ICD-10-CM

## 2011-05-21 LAB — BASIC METABOLIC PANEL
CO2: 27 mEq/L (ref 19–32)
Calcium: 9 mg/dL (ref 8.4–10.5)
Chloride: 105 mEq/L (ref 96–112)
Creatinine, Ser: 1.1 mg/dL (ref 0.4–1.5)
Glucose, Bld: 101 mg/dL — ABNORMAL HIGH (ref 70–99)

## 2011-05-21 LAB — HEPATIC FUNCTION PANEL
AST: 23 U/L (ref 0–37)
Alkaline Phosphatase: 60 U/L (ref 39–117)
Total Bilirubin: 0.9 mg/dL (ref 0.3–1.2)

## 2011-05-21 LAB — LIPID PANEL: Total CHOL/HDL Ratio: 2

## 2011-05-27 ENCOUNTER — Encounter: Payer: Self-pay | Admitting: *Deleted

## 2011-10-05 ENCOUNTER — Ambulatory Visit (INDEPENDENT_AMBULATORY_CARE_PROVIDER_SITE_OTHER): Payer: Medicare Other

## 2011-10-05 DIAGNOSIS — J019 Acute sinusitis, unspecified: Secondary | ICD-10-CM

## 2011-11-03 ENCOUNTER — Encounter: Payer: Medicare Other | Admitting: *Deleted

## 2011-11-16 ENCOUNTER — Other Ambulatory Visit: Payer: Self-pay | Admitting: *Deleted

## 2011-11-16 DIAGNOSIS — I6529 Occlusion and stenosis of unspecified carotid artery: Secondary | ICD-10-CM

## 2011-11-17 ENCOUNTER — Encounter (INDEPENDENT_AMBULATORY_CARE_PROVIDER_SITE_OTHER): Payer: Medicare Other | Admitting: *Deleted

## 2011-11-17 DIAGNOSIS — I6529 Occlusion and stenosis of unspecified carotid artery: Secondary | ICD-10-CM

## 2011-12-07 ENCOUNTER — Other Ambulatory Visit: Payer: Self-pay | Admitting: Cardiology

## 2011-12-07 MED ORDER — RAMIPRIL 5 MG PO CAPS: 5.0000 mg | ORAL_CAPSULE | Freq: Every day | ORAL | Status: DC

## 2011-12-07 NOTE — Telephone Encounter (Signed)
New Refill   Patient reordering RX Ramipril, preferred pharmacy is   [facility]  Mail Order pharmacy  Patient can be reached at hm# [phone] for additional questions

## 2012-01-29 ENCOUNTER — Other Ambulatory Visit: Payer: Self-pay | Admitting: Cardiology

## 2012-01-29 MED ORDER — NIACIN ER (ANTIHYPERLIPIDEMIC) 1000 MG PO TBCR: 1000.0000 mg | EXTENDED_RELEASE_TABLET | Freq: Every day | ORAL | Status: DC

## 2012-01-29 MED ORDER — PRAVASTATIN SODIUM 40 MG PO TABS: 40.0000 mg | ORAL_TABLET | Freq: Every day | ORAL | Status: DC

## 2012-03-21 ENCOUNTER — Ambulatory Visit (INDEPENDENT_AMBULATORY_CARE_PROVIDER_SITE_OTHER): Payer: Medicare Other | Admitting: Internal Medicine

## 2012-03-21 ENCOUNTER — Encounter: Payer: Self-pay | Admitting: Internal Medicine

## 2012-03-21 VITALS — BP 128/72 | HR 82 | Temp 98.5°F | Resp 16 | Wt 200.0 lb

## 2012-03-21 DIAGNOSIS — I10 Essential (primary) hypertension: Secondary | ICD-10-CM

## 2012-03-21 DIAGNOSIS — E785 Hyperlipidemia, unspecified: Secondary | ICD-10-CM

## 2012-03-21 DIAGNOSIS — S0990XA Unspecified injury of head, initial encounter: Secondary | ICD-10-CM | POA: Insufficient documentation

## 2012-03-21 DIAGNOSIS — S022XXA Fracture of nasal bones, initial encounter for closed fracture: Secondary | ICD-10-CM | POA: Insufficient documentation

## 2012-03-21 NOTE — Assessment & Plan Note (Signed)
He fell one week ago in [place], there are no records available to me today, he tells me that the CT of his head was normal, he appears to be healing nicely with no complications, 3 sutures were removed today and the nasal lac has healed nicely with no evidence of infection

## 2012-03-21 NOTE — Assessment & Plan Note (Signed)
His BP is well controlled 

## 2012-03-21 NOTE — Assessment & Plan Note (Signed)
He tells me that the xrays showed a non-displaced nasal fracture, this is healing with no evidence of complications

## 2012-03-21 NOTE — Progress Notes (Signed)
Subjective:    Patient ID: [name], male    DOB: 09/03/1932, 78 y.o.   MRN: [id number]  Head Injury  The incident occurred 5 to 7 days ago. The injury mechanism was a fall. There was no loss of consciousness. The volume of blood lost was minimal. The quality of the pain is described as aching. The pain is at a severity of 1/10 (painful nose). The pain is mild. The pain has been intermittent since the injury. Pertinent negatives include no blurred vision, disorientation, headaches, memory loss, numbness, tinnitus, vomiting or weakness. He has tried prescription drugs and NSAIDs for the symptoms. The treatment provided significant relief.      Review of Systems  Constitutional: Negative.   HENT: Positive for congestion and facial swelling. Negative for hearing loss, ear pain, nosebleeds, sore throat, rhinorrhea, sneezing, drooling, mouth sores, trouble swallowing, neck pain, neck stiffness, dental problem, voice change, postnasal drip, sinus pressure, tinnitus and ear discharge.   Eyes: Negative.  Negative for blurred vision.  Respiratory: Negative.   Cardiovascular: Negative for chest pain, palpitations and leg swelling.  Gastrointestinal: Negative.  Negative for vomiting.  Genitourinary: Negative.   Musculoskeletal: Negative.   Skin: Negative.   Neurological: Negative for dizziness, tremors, seizures, syncope, facial asymmetry, speech difficulty, weakness, light-headedness, numbness and headaches.  Hematological: Negative for adenopathy. Does not bruise/bleed easily.  Psychiatric/Behavioral: Negative for suicidal ideas, hallucinations, memory loss, behavioral problems, confusion, sleep disturbance, self-injury, dysphoric mood, decreased concentration and agitation. The patient is not nervous/anxious and is not hyperactive.        Objective:   Physical Exam  Vitals reviewed. Constitutional: He is oriented to person, place, and time. He appears well-developed and well-nourished.   Non-toxic appearance. He does not have a sickly appearance. He does not appear ill. No distress.  HENT:  Head: Not macrocephalic and not microcephalic. Head is with raccoon's eyes. Head is without Battle's sign, without abrasion, without contusion, without laceration, without right periorbital erythema and without left periorbital erythema. Hair is normal.    Right Ear: Hearing, tympanic membrane, external ear and ear canal normal. No hemotympanum.  Left Ear: Hearing, tympanic membrane, external ear and ear canal normal. No hemotympanum.  Nose: Nose lacerations and nasal deformity present. No mucosal edema, rhinorrhea, sinus tenderness, septal deviation or nasal septal hematoma. No epistaxis.  No foreign bodies. Right sinus exhibits no maxillary sinus tenderness and no frontal sinus tenderness. Left sinus exhibits no maxillary sinus tenderness and no frontal sinus tenderness.    Mouth/Throat: Mucous membranes are normal. Mucous membranes are not pale, not dry and not cyanotic. No oropharyngeal exudate, posterior oropharyngeal edema, posterior oropharyngeal erythema or tonsillar abscesses.  Eyes: Conjunctivae and EOM are normal. Pupils are equal, round, and reactive to light. Right eye exhibits no discharge. Left eye exhibits no discharge. No scleral icterus.  Neck: Normal range of motion. Neck supple. No JVD present. No tracheal deviation present. No thyromegaly present.  Cardiovascular: Normal rate, regular rhythm, normal heart sounds and intact distal pulses.  Exam reveals no gallop and no friction rub.   No murmur heard. Pulmonary/Chest: Effort normal and breath sounds normal. No stridor. No respiratory distress. He has no wheezes. He has no rales. He exhibits no tenderness.  Abdominal: Bowel sounds are normal. He exhibits no distension and no mass. There is no tenderness. There is no rebound and no guarding.  Musculoskeletal: Normal range of motion. He exhibits no edema and no tenderness.    Lymphadenopathy:    He has  no cervical adenopathy.  Neurological: He is alert and oriented to person, place, and time. He has normal strength. He displays no atrophy, no tremor and normal reflexes. No cranial nerve deficit or sensory deficit. He exhibits normal muscle tone. He displays a negative Romberg sign. He displays no seizure activity. Coordination and gait normal. He displays no Babinski's sign on the right side. He displays no Babinski's sign on the left side.  Reflex Scores:      Tricep reflexes are 1+ on the right side and 1+ on the left side.      Bicep reflexes are 1+ on the left side.      Brachioradialis reflexes are 1+ on the right side and 1+ on the left side.      Patellar reflexes are 1+ on the right side and 1+ on the left side.      Achilles reflexes are 1+ on the right side and 1+ on the left side. Skin: Skin is warm. No rash noted. He is not diaphoretic. No erythema. No pallor.  Psychiatric: He has a normal mood and affect. His behavior is normal. Judgment and thought content normal.     Lab Results  Component Value Date   WBC 5.6 08/07/2010   HGB 15.5 08/07/2010   HCT 45.7 08/07/2010   PLT 124* 08/07/2010   GLUCOSE 101* 05/21/2011   CHOL 127 05/21/2011   TRIG 56.0 05/21/2011   HDL 56.10 05/21/2011   LDLCALC 60 05/21/2011   ALT 17 05/21/2011   AST 23 05/21/2011   NA 140 05/21/2011   K 3.9 05/21/2011   CL 105 05/21/2011   CREATININE 1.1 05/21/2011   BUN 17 05/21/2011   CO2 27 05/21/2011       Assessment & Plan:

## 2012-03-25 ENCOUNTER — Telehealth: Payer: Self-pay | Admitting: Internal Medicine

## 2012-03-25 NOTE — Telephone Encounter (Signed)
Received 11 pages from [facility], sent to Dr. Yetta Barre. SD 03/25/12

## 2012-05-19 ENCOUNTER — Encounter: Payer: Self-pay | Admitting: Cardiology

## 2012-05-19 ENCOUNTER — Ambulatory Visit (INDEPENDENT_AMBULATORY_CARE_PROVIDER_SITE_OTHER): Payer: Medicare Other | Admitting: Cardiology

## 2012-05-19 VITALS — BP 151/76 | HR 67 | Ht 71.0 in | Wt 198.0 lb

## 2012-05-19 DIAGNOSIS — I679 Cerebrovascular disease, unspecified: Secondary | ICD-10-CM

## 2012-05-19 DIAGNOSIS — R0600 Dyspnea, unspecified: Secondary | ICD-10-CM

## 2012-05-19 DIAGNOSIS — R0609 Other forms of dyspnea: Secondary | ICD-10-CM

## 2012-05-19 DIAGNOSIS — R0989 Other specified symptoms and signs involving the circulatory and respiratory systems: Secondary | ICD-10-CM

## 2012-05-19 DIAGNOSIS — E785 Hyperlipidemia, unspecified: Secondary | ICD-10-CM

## 2012-05-19 DIAGNOSIS — I10 Essential (primary) hypertension: Secondary | ICD-10-CM

## 2012-05-19 NOTE — Assessment & Plan Note (Signed)
Continue statin. Check lipids and liver. 

## 2012-05-19 NOTE — Assessment & Plan Note (Signed)
Blood pressure mildly elevated but he follows this closely and it is typically controlled. Continue ACE inhibitor. Check potassium and renal function.

## 2012-05-19 NOTE — Assessment & Plan Note (Signed)
Patient has increased dyspnea compared to previous. Not volume overloaded on examination. I will arrange stress Myoview to exclude ischemia and reassess LV function.

## 2012-05-19 NOTE — Progress Notes (Signed)
   HPI: [name] is a pleasant gentleman who has a history of coronary artery disease status post coronary artery bypassing graft. His last Myoview was performed in August of 2011. His ejection fraction was preserved at 58%. There is a prior lateral infarct at the mid and basal level, but no ischemia was noted. 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 FU CT showed no aneurysm. Carotid Dopplers performed in Feb 2013 revealed 0-39% bilateral stenosis. I last saw him in August of 2012. Since then he notes increased dyspnea on exertion. There is no orthopnea, PND, pedal edema or exertional chest pain. No syncope.  Current Outpatient Prescriptions  Medication Sig Dispense Refill  . aspirin 81 MG tablet Take 81 mg by mouth daily.        . Calcium 600-200 MG-UNIT per tablet Take 1 tablet by mouth daily.        . multivitamin (THERAGRAN) per tablet Take 1 tablet by mouth daily.        . niacin (NIASPAN) 1000 MG CR tablet Take 1 tablet (1,000 mg total) by mouth at bedtime.  90 tablet  3  . pravastatin (PRAVACHOL) 40 MG tablet Take 1 tablet (40 mg total) by mouth at bedtime.  90 tablet  3  . ramipril (ALTACE) 5 MG capsule Take 1 capsule (5 mg total) by mouth daily.  90 capsule  3  . vitamin C (ASCORBIC ACID) 500 MG tablet Take 500 mg by mouth daily.           Past Medical History  Diagnosis Date  . Cerebrovascular disease   . Premature ventricular contractions   . Hyperlipidemia   . Coronary artery disease   . Hx of colonic polyps   . GERD (gastroesophageal reflux disease)   . Hypertension   . PSA elevation 02/20/10    PSA=1.7 with Dr. Earlene Hughes    Past Surgical History  Procedure Date  . Coronary artery bypass graft August 1996    LIMA to the LAD and diagonal, saphenous vein graft to the intermediate, obtuse marginale and circumflex, and a SVG to the RCA and PDA  . Appendectomy   . Tonsillectomy   . Cholecystectomy Nov 2011    History   Social History  . Marital  Status: Widowed    Spouse Name: N/A    Number of Children: N/A  . Years of Education: N/A   Occupational History  . Retired    Social History Main Topics  . Smoking status: Never Smoker   . Smokeless tobacco: Never Used  . Alcohol Use: No  . Drug Use: No  . Sexually Active: Not Currently   Other Topics Concern  . Not on file   Social History Narrative   Gets regular exercise.    ROS: no fevers or chills, productive cough, hemoptysis, dysphasia, odynophagia, melena, hematochezia, dysuria, hematuria, rash, seizure activity, orthopnea, PND, pedal edema, claudication. Remaining systems are negative.  Physical Exam: Well-developed well-nourished in no acute distress.  Skin is warm and dry.  HEENT is normal.  Neck is supple.  Chest is clear to auscultation with normal expansion. Previous sternotomy Cardiovascular exam is regular rate and rhythm.  Abdominal exam nontender or distended. No masses palpated. Extremities show no edema. neuro grossly intact  ECG sinus rhythm, right bundle branch block, left anterior fascicular block.

## 2012-05-19 NOTE — Patient Instructions (Signed)
Your physician wants you to follow-up in: 6 months  You will receive a reminder letter in the mail two months in advance. If you don't receive a letter, please call our office to schedule the follow-up appointment.   Your physician has requested that you have en exercise stress myoview.  Please follow instruction sheet, as given.  Your physician recommends that you return for a FASTING lipid profile: when you come for stress test have fasting labs done. bmet lipid hepatic.

## 2012-05-19 NOTE — Assessment & Plan Note (Signed)
Continue aspirin and statin. Most recent carotid Dopplers showed minimal plaque.

## 2012-05-31 ENCOUNTER — Other Ambulatory Visit: Payer: Medicare Other

## 2012-06-02 ENCOUNTER — Encounter: Payer: Self-pay | Admitting: *Deleted

## 2012-06-02 ENCOUNTER — Other Ambulatory Visit (INDEPENDENT_AMBULATORY_CARE_PROVIDER_SITE_OTHER): Payer: Medicare Other

## 2012-06-02 ENCOUNTER — Ambulatory Visit (HOSPITAL_COMMUNITY): Payer: Medicare Other | Attending: Cardiology | Admitting: Radiology

## 2012-06-02 VITALS — BP 142/76 | Ht 71.0 in | Wt 196.0 lb

## 2012-06-02 DIAGNOSIS — E785 Hyperlipidemia, unspecified: Secondary | ICD-10-CM | POA: Insufficient documentation

## 2012-06-02 DIAGNOSIS — R0600 Dyspnea, unspecified: Secondary | ICD-10-CM

## 2012-06-02 DIAGNOSIS — I10 Essential (primary) hypertension: Secondary | ICD-10-CM

## 2012-06-02 DIAGNOSIS — R0609 Other forms of dyspnea: Secondary | ICD-10-CM | POA: Insufficient documentation

## 2012-06-02 DIAGNOSIS — I679 Cerebrovascular disease, unspecified: Secondary | ICD-10-CM

## 2012-06-02 DIAGNOSIS — I2510 Atherosclerotic heart disease of native coronary artery without angina pectoris: Secondary | ICD-10-CM

## 2012-06-02 DIAGNOSIS — I4510 Unspecified right bundle-branch block: Secondary | ICD-10-CM | POA: Insufficient documentation

## 2012-06-02 DIAGNOSIS — R0602 Shortness of breath: Secondary | ICD-10-CM

## 2012-06-02 DIAGNOSIS — R0989 Other specified symptoms and signs involving the circulatory and respiratory systems: Secondary | ICD-10-CM | POA: Insufficient documentation

## 2012-06-02 DIAGNOSIS — R079 Chest pain, unspecified: Secondary | ICD-10-CM | POA: Insufficient documentation

## 2012-06-02 LAB — BASIC METABOLIC PANEL
Calcium: 9.3 mg/dL (ref 8.4–10.5)
GFR: 71.02 mL/min (ref >60.00)
Sodium: 138 mEq/L (ref 135–145)

## 2012-06-02 LAB — HEPATIC FUNCTION PANEL
AST: 24 U/L (ref 0–37)
Albumin: 3.6 g/dL (ref 3.5–5.2)
Total Bilirubin: 0.9 mg/dL (ref 0.3–1.2)

## 2012-06-02 LAB — LIPID PANEL
Cholesterol: 121 mg/dL (ref 0–200)
HDL: 45.5 mg/dL (ref >39.00)
LDL Cholesterol: 62 mg/dL (ref 0–99)
Triglycerides: 66 mg/dL (ref 0.0–149.0)
VLDL: 13.2 mg/dL (ref 0.0–40.0)

## 2012-06-02 MED ORDER — TECHNETIUM TC 99M TETROFOSMIN IV KIT
10.0000 | PACK | Freq: Once | INTRAVENOUS | Status: AC | PRN
  Administered 2012-06-02: 10 via INTRAVENOUS

## 2012-06-02 MED ORDER — TECHNETIUM TC 99M TETROFOSMIN IV KIT
30.0000 | PACK | Freq: Once | INTRAVENOUS | Status: AC | PRN
  Administered 2012-06-02: 30 via INTRAVENOUS

## 2012-06-02 NOTE — Progress Notes (Signed)
**Note Darren via Obfuscation** [facility] SITE 3 NUCLEAR MED [address] [place] [address] [phone]  Cardiology Nuclear Med Study  [name] is a 78 y.o. male     MRN : [id number]     DOB: [birth year]  Procedure Date: 06/02/2012  Nuclear Med Background Indication for Stress Test:  Evaluation for Ischemia and Graft Patency History:  96' CABG x7, 8/11'Myocardial Perfusion Study (-)ischemia,prior lateral infart,EF 58, HX PVCs Cardiac Risk Factors: Carotid Disease, Hypertension, Lipids and RBBB  Symptoms:  Chest Tightness with Exertion (last date of chest discomfort unable to be specific ), Dizziness and DOE   Nuclear Pre-Procedure Caffeine/Decaff Intake:  Hughes > 12 hrs NPO After: 11:00pm   Lungs:  clear O2 Sat: 96% on room air. IV 0.9% NS with Angio Cath:  22g  IV Site: R Antecubital x 1, tolerated well IV Started by:  Irean Hong, RN  Chest Size (in):  40 Cup Size: n/a  Height: 5\' 11"  (1.803 m)  Weight:  196 lb (88.905 kg)  BMI:  Body mass index is 27.34 kg/(m^2). Tech Comments:  n/a    Nuclear Med Study 1 or 2 day study: 1 day  Stress Test Type:  Stress  Reading MD: Olga Millers, MD  Order Authorizing Provider:  Olga Millers, MD  Resting Radionuclide: Technetium 41m Tetrofosmin  Resting Radionuclide Dose: 11.0 mCi   Stress Radionuclide:  Technetium 42m Tetrofosmin  Stress Radionuclide Dose: 33.0 mCi           Stress Protocol Rest HR: 54 Stress HR: 141  Rest BP: 144/78 Stress BP: 219/74  Exercise Time (min): 8:00 mins METS: 10.10   Predicted Max HR: 142 bpm % Max HR: 99.3 bpm Rate Pressure Product: 29528   Dose of Adenosine (mg):  n/a Dose of Lexiscan: n/a mg  Dose of Atropine (mg): n/a Dose of Dobutamine: n/a mcg/kg/min (at max HR)  Stress Test Technologist: Frederick Peers, EMT-P  Nuclear Technologist:  Domenic Polite, CNMT     Rest Procedure:  Myocardial perfusion imaging was performed at rest 45 minutes following the intravenous administration of  Technetium 56m Tetrofosmin. Rest ECG: SB RBBB  Stress Procedure:  The patient performed treadmill exercise using a Bruce  Protocol for 8:00 minutes. The patient stopped due to SOB and denied any chest pain. Patient had a HTN response. There were no significant ST-T wave changes with occ PVCs during recovery..  Technetium 83m Tetrofosmin was injected at peak exercise and myocardial perfusion imaging was performed after a brief delay. Stress ECG: No significant ST segment change suggestive of ischemia.  QPS Raw Data Images:  Acquisition technically good; normal left ventricular size. Stress Images:  There is decreased uptake in the inferolateral wall. Rest Images:  There is decreased uptake in the inferolateral wall, slightly less prominent compared to the stress images. Subtraction (SDS):  These findings are consistent with prior infarct and very mild peri-infarct ischemia. Transient Ischemic Dilatation (Normal <1.22):  0.82 Lung/Heart Ratio (Normal <0.45):  0.35  Quantitative Gated Spect Images QGS EDV:  84 ml QGS ESV:  35 ml  Impression Exercise Capacity:  Fair exercise capacity. BP Response:  Hypertensive blood pressure response. Clinical Symptoms:  There is dyspnea. ECG Impression:  No significant ST segment change suggestive of ischemia. Comparison with Prior Nuclear Study: No images to compare  Overall Impression:  Abnormal stress nuclear study with a moderate size, medium intensity, partially reversible inferolateral defect consistent with prior infarct and very mild peri-infarct ischemia.  LV Ejection Fraction: 59%.  LV Wall Motion:  Inferolateral hypokinesis.  Olga Millers

## 2012-11-10 ENCOUNTER — Telehealth: Payer: Self-pay | Admitting: Cardiology

## 2012-11-10 MED ORDER — RAMIPRIL 5 MG PO CAPS: 5.0000 mg | ORAL_CAPSULE | Freq: Every day | ORAL | Status: DC

## 2012-11-10 NOTE — Telephone Encounter (Signed)
New Problem:    Patient called in needing a 90 day refill of his niacin (NIASPAN) 1000 MG CR tablet, pravastatin (PRAVACHOL) 40 MG tablet and ramipril (ALTACE) 5 MG capsule called into [facility] on [address].

## 2012-11-11 ENCOUNTER — Other Ambulatory Visit: Payer: Self-pay

## 2012-11-11 ENCOUNTER — Telehealth: Payer: Self-pay

## 2012-11-11 MED ORDER — PRAVASTATIN SODIUM 40 MG PO TABS: 40.0000 mg | ORAL_TABLET | Freq: Every day | ORAL | Status: DC

## 2012-11-11 MED ORDER — RAMIPRIL 5 MG PO CAPS: 5.0000 mg | ORAL_CAPSULE | Freq: Every day | ORAL | Status: DC

## 2012-11-11 MED ORDER — NIACIN ER (ANTIHYPERLIPIDEMIC) 1000 MG PO TBCR: 1000.0000 mg | EXTENDED_RELEASE_TABLET | Freq: Every day | ORAL | Status: DC

## 2012-11-11 NOTE — Telephone Encounter (Signed)
[facility] pharmacy called to get refills sent over for [name] pravastatin, altace, and niacin because he can no longer afford mail order prices. Refills sent to [facility] 90 day supply with two (2) remaining refills. Called [facility] back after sending refills and verified they received them. They did and [name] is there ready to pick medication(s) up.

## 2013-05-11 IMAGING — NM NM MISC PROCEDURE
1 series · 12 of 12 positions shown · non-contrast
Comparison: none

[Series 1: rest raw · 6.40mm/px · 2 acquisitions, 12 frames shown]
[im 1/2]
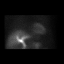
[im 1/2]
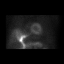
[im 1/2]
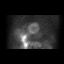
[im 1/2]
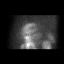
[im 1/2]
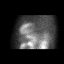
[im 1/2]
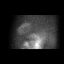
[im 2/2]
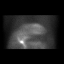
[im 2/2]
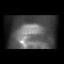
[im 2/2]
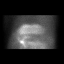
[im 2/2]
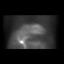
[im 2/2]
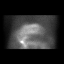
[im 2/2]
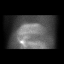

[12 of 12 positions shown; findings below may reference images not displayed]

Canned report from images found in remote index.

Refer to host system for actual result text.

## 2013-06-15 ENCOUNTER — Encounter: Payer: Self-pay | Admitting: Cardiology

## 2013-06-15 ENCOUNTER — Ambulatory Visit (INDEPENDENT_AMBULATORY_CARE_PROVIDER_SITE_OTHER): Payer: Medicare Other | Admitting: Cardiology

## 2013-06-15 ENCOUNTER — Encounter: Payer: Self-pay | Admitting: *Deleted

## 2013-06-15 VITALS — BP 144/82 | HR 64 | Ht 71.0 in | Wt 198.0 lb

## 2013-06-15 DIAGNOSIS — I714 Abdominal aortic aneurysm, without rupture, unspecified: Secondary | ICD-10-CM

## 2013-06-15 DIAGNOSIS — I10 Essential (primary) hypertension: Secondary | ICD-10-CM

## 2013-06-15 DIAGNOSIS — E785 Hyperlipidemia, unspecified: Secondary | ICD-10-CM

## 2013-06-15 DIAGNOSIS — I2510 Atherosclerotic heart disease of native coronary artery without angina pectoris: Secondary | ICD-10-CM

## 2013-06-15 LAB — BASIC METABOLIC PANEL
CO2: 29 mEq/L (ref 19–32)
Calcium: 9.5 mg/dL (ref 8.4–10.5)
Chloride: 105 mEq/L (ref 96–112)
Glucose, Bld: 100 mg/dL — ABNORMAL HIGH (ref 70–99)
Sodium: 138 mEq/L (ref 135–145)

## 2013-06-15 LAB — LIPID PANEL
HDL: 44.8 mg/dL (ref >39.00)
LDL Cholesterol: 60 mg/dL (ref 0–99)
Total CHOL/HDL Ratio: 3

## 2013-06-15 LAB — HEPATIC FUNCTION PANEL
AST: 25 U/L (ref 0–37)
Alkaline Phosphatase: 48 U/L (ref 39–117)
Total Bilirubin: 1 mg/dL (ref 0.3–1.2)

## 2013-06-15 NOTE — Assessment & Plan Note (Signed)
Continue aspirin and statin. 

## 2013-06-15 NOTE — Patient Instructions (Addendum)
Your physician wants you to follow-up in: ONE YEAR WITH DR CRENSHAW You will receive a reminder letter in the mail two months in advance. If you don't receive a letter, please call our office to schedule the follow-up appointment.   Your physician has requested that you have an abdominal aorta duplex. During this test, an ultrasound is used to evaluate the aorta. Allow 30 minutes for this exam. Do not eat after midnight the day before and avoid carbonated beverages   Your physician recommends that you HAVE LAB WORK TODAY 

## 2013-06-15 NOTE — Assessment & Plan Note (Signed)
Blood pressure controlled. Continue present medications. Check potassium and renal function. 

## 2013-06-15 NOTE — Assessment & Plan Note (Signed)
Previous ultrasound in 2011 showed aneurysm but followup CT did not. I will repeat his ultrasound.

## 2013-06-15 NOTE — Progress Notes (Signed)
   HPI: FU coronary artery disease status post coronary artery bypassing graft. His last Myoview was performed in August of 2013. His ejection fraction was preserved at 59%. Prior inferolateral infarct with very mild peri-infarct ischemia. 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 FU CT showed no aneurysm. Carotid Dopplers performed in Feb 2013 revealed 0-39% bilateral stenosis. I last saw him in August of 2013. Since then he notes some dyspnea on exertion. There is no orthopnea, PND, pedal edema or exertional chest pain. No syncope.   Current Outpatient Prescriptions  Medication Sig Dispense Refill  . aspirin 81 MG tablet Take 81 mg by mouth daily.        . Calcium 600-200 MG-UNIT per tablet Take 1 tablet by mouth daily.        . multivitamin (THERAGRAN) per tablet Take 1 tablet by mouth daily.        . niacin (NIASPAN) 1000 MG CR tablet Take 1 tablet (1,000 mg total) by mouth at bedtime.  90 tablet  2  . pravastatin (PRAVACHOL) 40 MG tablet Take 1 tablet (40 mg total) by mouth at bedtime.  90 tablet  2  . ramipril (ALTACE) 5 MG capsule Take 1 capsule (5 mg total) by mouth daily.  90 capsule  2  . vitamin C (ASCORBIC ACID) 500 MG tablet Take 500 mg by mouth daily.         No current facility-administered medications for this visit.     Past Medical History  Diagnosis Date  . Cerebrovascular disease   . Premature ventricular contractions   . Hyperlipidemia   . Coronary artery disease   . Hx of colonic polyps   . GERD (gastroesophageal reflux disease)   . Hypertension   . PSA elevation 02/20/10    PSA=1.7 with Dr. Earlene Plater    Past Surgical History  Procedure Laterality Date  . Coronary artery bypass graft  August 1996    LIMA to the LAD and diagonal, saphenous vein graft to the intermediate, obtuse marginale and circumflex, and a SVG to the RCA and PDA  . Appendectomy    . Tonsillectomy    . Cholecystectomy  Nov 2011    History   Social History  . Marital  Status: Widowed    Spouse Name: N/A    Number of Children: N/A  . Years of Education: N/A   Occupational History  . Retired    Social History Main Topics  . Smoking status: Never Smoker   . Smokeless tobacco: Never Used  . Alcohol Use: No  . Drug Use: No  . Sexual Activity: Not Currently   Other Topics Concern  . Not on file   Social History Narrative   Gets regular exercise.    ROS: no fevers or chills, productive cough, hemoptysis, dysphasia, odynophagia, melena, hematochezia, dysuria, hematuria, rash, seizure activity, orthopnea, PND, pedal edema, claudication. Remaining systems are negative.  Physical Exam: Well-developed well-nourished in no acute distress.  Skin is warm and dry.  HEENT is normal.  Neck is supple.  Chest is clear to auscultation with normal expansion.  Cardiovascular exam is regular rate and rhythm.  Abdominal exam nontender or distended. No masses palpated. Extremities show no edema. neuro grossly intact  ECG sinus rhythm at a rate of 64. Right bundle branch block. Left anterior fascicular block.

## 2013-06-15 NOTE — Assessment & Plan Note (Signed)
Continue statin. Check lipids and liver. 

## 2013-06-22 ENCOUNTER — Encounter (INDEPENDENT_AMBULATORY_CARE_PROVIDER_SITE_OTHER): Payer: Medicare Other

## 2013-06-22 DIAGNOSIS — I714 Abdominal aortic aneurysm, without rupture, unspecified: Secondary | ICD-10-CM

## 2013-06-22 DIAGNOSIS — I700 Atherosclerosis of aorta: Secondary | ICD-10-CM

## 2013-08-09 ENCOUNTER — Other Ambulatory Visit: Payer: Self-pay | Admitting: Cardiology

## 2013-12-04 ENCOUNTER — Ambulatory Visit (INDEPENDENT_AMBULATORY_CARE_PROVIDER_SITE_OTHER): Payer: Medicare HMO | Admitting: Family Medicine

## 2013-12-04 ENCOUNTER — Ambulatory Visit: Payer: Medicare Other

## 2013-12-04 VITALS — BP 140/84 | HR 80 | Temp 98.0°F | Resp 16 | Ht 71.5 in | Wt 202.0 lb

## 2013-12-04 DIAGNOSIS — S0181XA Laceration without foreign body of other part of head, initial encounter: Secondary | ICD-10-CM

## 2013-12-04 DIAGNOSIS — R519 Headache, unspecified: Secondary | ICD-10-CM

## 2013-12-04 DIAGNOSIS — R51 Headache: Principal | ICD-10-CM

## 2013-12-04 DIAGNOSIS — S01512A Laceration without foreign body of oral cavity, initial encounter: Secondary | ICD-10-CM

## 2013-12-04 DIAGNOSIS — S025XXA Fracture of tooth (traumatic), initial encounter for closed fracture: Secondary | ICD-10-CM

## 2013-12-04 DIAGNOSIS — S0180XA Unspecified open wound of other part of head, initial encounter: Secondary | ICD-10-CM

## 2013-12-04 MED ORDER — MUPIROCIN 2 % EX OINT: 1.0000 "application " | TOPICAL_OINTMENT | Freq: Two times a day (BID) | CUTANEOUS | Status: DC

## 2013-12-04 NOTE — Progress Notes (Signed)
 Chief Complaint:  Chief Complaint  Patient presents with  . Fall    pt fell misstepped from curb went facedown on pavement cut bridge of nose and upper lip    HPI: [name] is a 80 y.o. male who is here for facial pain s.p fell this AM. He has a misstep off of curb because he was not paying attention to where he was going and stepped off curb and hit his face on cement. He deneis LOC. He denies confusion. He braced himself. He is not on any blood thinners except baby apsirin. Braced himself on knee and hands, no pain on his knees or hand or wirst.   Past Medical History  Diagnosis Date  . Cerebrovascular disease   . Premature ventricular contractions   . Hyperlipidemia   . Coronary artery disease   . Hx of colonic polyps   . GERD (gastroesophageal reflux disease)   . Hypertension   . PSA elevation 02/20/10    PSA=1.7 with Dr. Rosana Hoes   Past Surgical History  Procedure Laterality Date  . Coronary artery bypass graft  August 1996    LIMA to the LAD and diagonal, saphenous vein graft to the intermediate, obtuse marginale and circumflex, and a SVG to the RCA and PDA  . Appendectomy    . Tonsillectomy    . Cholecystectomy  Nov 2011   History   Social History  . Marital Status: Widowed    Spouse Name: N/A    Number of Children: N/A  . Years of Education: N/A   Occupational History  . Retired    Social History Main Topics  . Smoking status: Never Smoker   . Smokeless tobacco: Never Used  . Alcohol Use: No  . Drug Use: No  . Sexual Activity: Not Currently   Other Topics Concern  . Not on file   Social History Narrative   Gets regular exercise.   Family History  Problem Relation Age of Onset  . Prostate cancer      1st degree relative <50  . Cancer Other     1st degree relative   No Known Allergies Prior to Admission medications   Medication Sig Start Date End Date Taking? Authorizing Provider  aspirin 81 MG tablet Take 81 mg by mouth daily.      Yes Historical Provider, MD  Calcium 600-200 MG-UNIT per tablet Take 1 tablet by mouth daily.     Yes Historical Provider, MD  multivitamin [facility]) per tablet Take 1 tablet by mouth daily.     Yes Historical Provider, MD  niacin (NIASPAN) 1000 MG CR tablet TAKE ONE TABLET BY MOUTH AT BEDTIME 08/09/13  Yes Lelon Perla, MD  pravastatin (PRAVACHOL) 40 MG tablet TAKE ONE TABLET BY MOUTH AT BEDTIME 08/09/13  Yes Lelon Perla, MD  ramipril (ALTACE) 5 MG capsule TAKE ONE CAPSULE BY MOUTH EVERY DAY 08/09/13  Yes Lelon Perla, MD  vitamin C (ASCORBIC ACID) 500 MG tablet Take 500 mg by mouth daily.     Yes Historical Provider, MD     ROS: The patient denies fevers, chills, night sweats, unintentional weight loss, chest pain, palpitations, wheezing, dyspnea on exertion, nausea, vomiting, abdominal pain, dysuria, hematuria, melena, numbness, weakness, or tingling.   All other systems have been reviewed and were otherwise negative with the exception of those mentioned in the HPI and as above.    PHYSICAL EXAM: Filed Vitals:   12/04/13 1157  BP: 140/84  Pulse: 80  Temp: 98 F (36.7 C)  Resp: 16   Filed Vitals:   12/04/13 1157  Height: 5' 11.5" (1.816 m)  Weight: 202 lb (91.627 kg)   Body mass index is 27.78 kg/(m^2).  General: Alert, no acute distress HEENT:  Normocephalic, atraumatic, oropharynx patent. EOMI, PERRLA, fundo exam nl. No epistaxis, no bruises behind ears or around eyes Cardiovascular:  Regular rate and rhythm, no rubs murmurs or gallops.  No Carotid bruits, radial pulse intact. No pedal edema.  Respiratory: Clear to auscultation bilaterally.  No wheezes, rales, or rhonchi.  No cyanosis, no use of accessory musculature GI: No organomegaly, abdomen is soft and non-tender, positive bowel sounds.  No masses. Skin: + facial lac on bridge of nose, abrasion, . Inner upper mucosal lac Neurologic: Facial musculature symmetric. Psychiatric: Patient is appropriate  throughout our interaction. Lymphatic: No cervical lymphadenopathy Musculoskeletal: Gait intact. Head and neck-negative for any injuries, full ROM.  Shoulder elbow wrist and hand-normal exam, 5/5 strength, full ROM, sensation intact He has a left knee abrasion but no pain, full rom    LABS: Results for orders placed in visit on 06/15/13  LIPID PANEL      Result Value Ref Range   Cholesterol 120  0 - 200 mg/dL   Triglycerides 75.0  0.0 - 149.0 mg/dL   HDL 44.80  >39.00 mg/dL   VLDL 15.0  0.0 - 40.0 mg/dL   LDL Cholesterol 60  0 - 99 mg/dL   Total CHOL/HDL Ratio 3    HEPATIC FUNCTION PANEL      Result Value Ref Range   Total Bilirubin 1.0  0.3 - 1.2 mg/dL   Bilirubin, Direct 0.1  0.0 - 0.3 mg/dL   Alkaline Phosphatase 48  39 - 117 U/L   AST 25  0 - 37 U/L   ALT 14  0 - 53 U/L   Total Protein 7.1  6.0 - 8.3 g/dL   Albumin 3.9  3.5 - 5.2 g/dL  BASIC METABOLIC PANEL      Result Value Ref Range   Sodium 138  135 - 145 mEq/L   Potassium 4.2  3.5 - 5.1 mEq/L   Chloride 105  96 - 112 mEq/L   CO2 29  19 - 32 mEq/L   Glucose, Bld 100 (*) 70 - 99 mg/dL   BUN 18  6 - 23 mg/dL   Creatinine, Ser 1.1  0.4 - 1.5 mg/dL   Calcium 9.5  8.4 - 10.5 mg/dL   GFR 67.19  >60.00 mL/min     EKG/XRAY:   Primary read interpreted by Dr. Marin Comment at [facility]. No obvious fx/dislocation  ASSESSMENT/PLAN: Encounter Diagnoses  Name Primary?  . Facial pain Yes  . Laceration of oral cavity   . Facial laceration    Advise not to take any NSAIDs except his ASA Rx Bactroban for lacerations on face Cold compresses on face to help with swelling Go see dentist for loose front teeth He had a mucosal lac on upper inner lip but not deep enough to warrant stitches F/u prn   Gross sideeffects, risk and benefits, and alternatives of medications d/w patient. Patient is aware that all medications have potential sideeffects and we are unable to predict every sideeffect or drug-drug interaction that may occur.  ,  [place], DO 12/04/2013 1:11 PM

## 2013-12-08 ENCOUNTER — Ambulatory Visit (INDEPENDENT_AMBULATORY_CARE_PROVIDER_SITE_OTHER): Payer: Managed Care, Other (non HMO) | Admitting: Internal Medicine

## 2013-12-08 ENCOUNTER — Encounter: Payer: Self-pay | Admitting: Internal Medicine

## 2013-12-08 ENCOUNTER — Telehealth: Payer: Self-pay | Admitting: Cardiology

## 2013-12-08 VITALS — BP 118/70 | HR 94 | Temp 98.1°F | Resp 16 | Ht 71.5 in | Wt 193.5 lb

## 2013-12-08 DIAGNOSIS — S0081XA Abrasion of other part of head, initial encounter: Secondary | ICD-10-CM

## 2013-12-08 DIAGNOSIS — K219 Gastro-esophageal reflux disease without esophagitis: Secondary | ICD-10-CM

## 2013-12-08 DIAGNOSIS — IMO0002 Reserved for concepts with insufficient information to code with codable children: Secondary | ICD-10-CM

## 2013-12-08 MED ORDER — ONDANSETRON HCL 4 MG PO TABS: 4.0000 mg | ORAL_TABLET | Freq: Three times a day (TID) | ORAL | Status: DC | PRN

## 2013-12-08 MED ORDER — ESOMEPRAZOLE MAGNESIUM 40 MG PO CPDR: 40.0000 mg | DELAYED_RELEASE_CAPSULE | Freq: Every day | ORAL | Status: DC

## 2013-12-08 NOTE — Telephone Encounter (Signed)
Spoke with pt, for some time now he has been having a lot of indigestion and heartburn that goes up into his chest. He has been taking a lot of OTC things which did help at first but his symptoms seem to be getting worse. He saw his primary care today and was given nexium 40 mg once daily. He fell the first part of the week and he is having trouble eating because of the damage to his teeth from the fall. He requested an appointment to be seen to make sure everything with his heart is okay. appt made for pt to see NP next week.

## 2013-12-08 NOTE — Patient Instructions (Signed)
Gastroesophageal Reflux Disease, Adult  Gastroesophageal reflux disease (GERD) happens when acid from your stomach flows up into the esophagus. When acid comes in contact with the esophagus, the acid causes soreness (inflammation) in the esophagus. Over time, GERD may create small holes (ulcers) in the lining of the esophagus.  CAUSES   · Increased body weight. This puts pressure on the stomach, making acid rise from the stomach into the esophagus.  · Smoking. This increases acid production in the stomach.  · Drinking alcohol. This causes decreased pressure in the lower esophageal sphincter (valve or ring of muscle between the esophagus and stomach), allowing acid from the stomach into the esophagus.  · Late evening meals and a full stomach. This increases pressure and acid production in the stomach.  · A malformed lower esophageal sphincter.  Sometimes, no cause is found.  SYMPTOMS   · Burning pain in the lower part of the mid-chest behind the breastbone and in the mid-stomach area. This may occur twice a week or more often.  · Trouble swallowing.  · Sore throat.  · Dry cough.  · Asthma-like symptoms including chest tightness, shortness of breath, or wheezing.  DIAGNOSIS   Your caregiver may be able to diagnose GERD based on your symptoms. In some cases, X-rays and other tests may be done to check for complications or to check the condition of your stomach and esophagus.  TREATMENT   Your caregiver may recommend over-the-counter or prescription medicines to help decrease acid production. Ask your caregiver before starting or adding any new medicines.   HOME CARE INSTRUCTIONS   · Change the factors that you can control. Ask your caregiver for guidance concerning weight loss, quitting smoking, and alcohol consumption.  · Avoid foods and drinks that make your symptoms worse, such as:  · Caffeine or alcoholic drinks.  · Chocolate.  · Peppermint or mint flavorings.  · Garlic and onions.  · Spicy foods.  · Citrus fruits,  such as oranges, lemons, or limes.  · Tomato-based foods such as sauce, chili, salsa, and pizza.  · Fried and fatty foods.  · Avoid lying down for the 3 hours prior to your bedtime or prior to taking a nap.  · Eat small, frequent meals instead of large meals.  · Wear loose-fitting clothing. Do not wear anything tight around your waist that causes pressure on your stomach.  · Raise the head of your bed 6 to 8 inches with wood blocks to help you sleep. Extra pillows will not help.  · Only take over-the-counter or prescription medicines for pain, discomfort, or fever as directed by your caregiver.  · Do not take aspirin, ibuprofen, or other nonsteroidal anti-inflammatory drugs (NSAIDs).  SEEK IMMEDIATE MEDICAL CARE IF:   · You have pain in your arms, neck, jaw, teeth, or back.  · Your pain increases or changes in intensity or duration.  · You develop nausea, vomiting, or sweating (diaphoresis).  · You develop shortness of breath, or you faint.  · Your vomit is green, yellow, black, or looks like coffee grounds or blood.  · Your stool is red, bloody, or black.  These symptoms could be signs of other problems, such as heart disease, gastric bleeding, or esophageal bleeding.  MAKE SURE YOU:   · Understand these instructions.  · Will watch your condition.  · Will get help right away if you are not doing well or get worse.  Document Released: 07/08/2005 Document Revised: 12/21/2011 Document Reviewed: 04/17/2011  [employer]® Patient   Information ©2014 [employer].

## 2013-12-08 NOTE — Assessment & Plan Note (Signed)
Will start nexium and he can try zofran for the more severe s/s

## 2013-12-08 NOTE — Progress Notes (Signed)
Pre visit review using our clinic review tool, if applicable. No additional management support is needed unless otherwise documented below in the visit note. 

## 2013-12-08 NOTE — Telephone Encounter (Signed)
New message         C/o burning in chest, vomitting, and pt fell. Pt would like to come in the office to see dr Stanford Breed soon.

## 2013-12-08 NOTE — Assessment & Plan Note (Signed)
These appear to be healing well with no complications

## 2013-12-08 NOTE — Progress Notes (Signed)
Subjective:    Patient ID: [name], male    DOB: 26-Dec-1931, 80 y.o.   MRN: [id number]  Gastrophageal Reflux He complains of belching, choking, globus sensation, heartburn, nausea and water brash. He reports no abdominal pain, no chest pain, no coughing, no dysphagia, no early satiety, no hoarse voice, no sore throat, no stridor, no tooth decay or no wheezing. This is a chronic problem. The current episode started more than 1 year ago. The problem occurs frequently. The problem has been gradually worsening. The heartburn duration is more than one hour. The heartburn is located in the substernum. The heartburn is of severe intensity. The heartburn wakes him from sleep. The heartburn does not limit his activity. The heartburn doesn't change with position. The symptoms are aggravated by certain foods. Pertinent negatives include no anemia, fatigue, melena, muscle weakness, orthopnea or weight loss. He has tried an antacid and a histamine-2 antagonist (tums and zantac) for the symptoms. The treatment provided mild relief.      Review of Systems  Constitutional: Negative.  Negative for fever, chills, weight loss, diaphoresis and fatigue.  HENT: Negative.  Negative for hoarse voice and sore throat.   Eyes: Negative.   Respiratory: Positive for choking. Negative for apnea, cough, chest tightness, shortness of breath, wheezing and stridor.   Cardiovascular: Negative.  Negative for chest pain, palpitations and leg swelling.  Gastrointestinal: Positive for heartburn and nausea. Negative for dysphagia, vomiting, abdominal pain, diarrhea, constipation, blood in stool, melena, anal bleeding and rectal pain.  Endocrine: Negative.   Genitourinary: Negative.   Musculoskeletal: Negative.  Negative for muscle weakness.  Skin: Negative.   Allergic/Immunologic: Negative.   Neurological: Negative.  Negative for dizziness, weakness and light-headedness.  Hematological: Negative.  Negative for adenopathy.  Does not bruise/bleed easily.  Psychiatric/Behavioral: Negative.        Objective:   Physical Exam  Vitals reviewed. Constitutional: He is oriented to person, place, and time. He appears well-developed and well-nourished. No distress.  HENT:  Head: Normocephalic and atraumatic.    Mouth/Throat: Oropharynx is clear and moist. No oropharyngeal exudate.  Eyes: Conjunctivae are normal. Right eye exhibits no discharge. Left eye exhibits no discharge. No scleral icterus.  Neck: Normal range of motion. Neck supple. No JVD present. No tracheal deviation present. No thyromegaly present.  Cardiovascular: Normal rate, regular rhythm, normal heart sounds and intact distal pulses.  Exam reveals no gallop and no friction rub.   No murmur heard. Pulmonary/Chest: Effort normal and breath sounds normal. No stridor. No respiratory distress. He has no wheezes. He has no rales. He exhibits no tenderness.  Abdominal: Soft. Bowel sounds are normal. He exhibits no distension and no mass. There is no tenderness. There is no rebound and no guarding.  Musculoskeletal: Normal range of motion. He exhibits no edema and no tenderness.  Lymphadenopathy:    He has no cervical adenopathy.  Neurological: He is oriented to person, place, and time.  Skin: Skin is warm and dry. No rash noted. He is not diaphoretic. No erythema. No pallor.  Psychiatric: He has a normal mood and affect. His behavior is normal. Judgment and thought content normal.     Lab Results  Component Value Date   WBC 5.6 08/07/2010   HGB 15.5 08/07/2010   HCT 45.7 08/07/2010   PLT 124* 08/07/2010   GLUCOSE 100* 06/15/2013   CHOL 120 06/15/2013   TRIG 75.0 06/15/2013   HDL 44.80 06/15/2013   LDLCALC 60 06/15/2013   ALT 14  06/15/2013   AST 25 06/15/2013   NA 138 06/15/2013   K 4.2 06/15/2013   CL 105 06/15/2013   CREATININE 1.1 06/15/2013   BUN 18 06/15/2013   CO2 29 06/15/2013       Assessment & Plan:

## 2013-12-12 ENCOUNTER — Ambulatory Visit (INDEPENDENT_AMBULATORY_CARE_PROVIDER_SITE_OTHER): Payer: Managed Care, Other (non HMO) | Admitting: Nurse Practitioner

## 2013-12-12 ENCOUNTER — Encounter: Payer: Self-pay | Admitting: Nurse Practitioner

## 2013-12-12 VITALS — BP 110/70 | HR 68 | Ht 71.0 in | Wt 195.8 lb

## 2013-12-12 DIAGNOSIS — E785 Hyperlipidemia, unspecified: Secondary | ICD-10-CM

## 2013-12-12 DIAGNOSIS — I259 Chronic ischemic heart disease, unspecified: Secondary | ICD-10-CM

## 2013-12-12 DIAGNOSIS — I679 Cerebrovascular disease, unspecified: Secondary | ICD-10-CM

## 2013-12-12 DIAGNOSIS — R079 Chest pain, unspecified: Secondary | ICD-10-CM

## 2013-12-12 LAB — BASIC METABOLIC PANEL
BUN: 19 mg/dL (ref 6–23)
CO2: 28 mEq/L (ref 19–32)
Calcium: 9.3 mg/dL (ref 8.4–10.5)
Chloride: 104 mEq/L (ref 96–112)
Creatinine, Ser: 1.2 mg/dL (ref 0.4–1.5)
GFR: 61.97 mL/min (ref >60.00)
Glucose, Bld: 92 mg/dL (ref 70–99)
Potassium: 3.8 mEq/L (ref 3.5–5.1)
Sodium: 138 mEq/L (ref 135–145)

## 2013-12-12 LAB — LIPID PANEL
Cholesterol: 126 mg/dL (ref 0–200)
HDL: 39.7 mg/dL (ref >39.00)
LDL Cholesterol: 63 mg/dL (ref 0–99)
Total CHOL/HDL Ratio: 3
Triglycerides: 116 mg/dL (ref 0.0–149.0)
VLDL: 23.2 mg/dL (ref 0.0–40.0)

## 2013-12-12 LAB — HEPATIC FUNCTION PANEL
ALT: 19 U/L (ref 0–53)
AST: 28 U/L (ref 0–37)
Albumin: 3.8 g/dL (ref 3.5–5.2)
Alkaline Phosphatase: 48 U/L (ref 39–117)
Bilirubin, Direct: 0.1 mg/dL (ref 0.0–0.3)
Total Bilirubin: 1.2 mg/dL (ref 0.3–1.2)
Total Protein: 6.9 g/dL (ref 6.0–8.3)

## 2013-12-12 NOTE — Patient Instructions (Addendum)
We will arrange for a stress Myoview  See Dr. Stanford Breed back as planned for September  Stay on your current medicines  We will recheck fasting labs today  Call the [place] office at [phone] if you have any questions, problems or concerns.

## 2013-12-12 NOTE — Progress Notes (Signed)
[name] Date of Birth: Nov 18, 1931 Medical Record #[id number]  History of Present Illness: [name] is seen back today for a work in visit. Seen for [name]. He has known CAD with past CABG in 1996 with LIMA to LAD and DX, SVG to intermediate - OM - LCX and SVG to RCA - PD per [name]. Last Myoview in August of 2013. Ef 59% with prior inferolateral infarct and very mild peri-infarct ischemia. Other issues include PVD, HLD, PVCs and GERD. Has RBBB.   Last seen here in September of 2014. Was doing ok. Did note some DOE. Overall felt to be doing ok.  Comes back today. Here alone. Called last week with complaints of indigestion. Saw his PCP and given Nexium. Wanted to make sure that everything with his heart was ok. Tells me that he has had indigestion - most at night before as he goes to bed - for several weeks. Sometimes he will feel a discomfort in his upper chest. With the Nexium - all of his symptoms are resolved. He continues to go to the gym and has had no pain but notes some decrease in his stamina. Does note more fatigue in a generalized fashion. No NTG use. Does not remember what his prior chest pain syndrome felt like. Not short of breath. BP ok. Did have a fall last week but "missed a step". No syncope.   Current Outpatient Prescriptions  Medication Sig Dispense Refill  . aspirin 81 MG tablet Take 81 mg by mouth daily.        . Calcium 600-200 MG-UNIT per tablet Take 1 tablet by mouth daily.        Marland Kitchen esomeprazole (NEXIUM) 40 MG capsule Take 1 capsule (40 mg total) by mouth daily.  50 capsule  0  . multivitamin (THERAGRAN) per tablet Take 1 tablet by mouth daily.        . mupirocin ointment (BACTROBAN) 2 % Place 1 application into the nose 2 (two) times daily.  22 g  0  . niacin (NIASPAN) 1000 MG CR tablet TAKE ONE TABLET BY MOUTH AT BEDTIME  90 tablet  2  . ondansetron (ZOFRAN) 4 MG tablet Take 1 tablet (4 mg total) by mouth every 8 (eight) hours as needed for nausea or  vomiting.  20 tablet  0  . pravastatin (PRAVACHOL) 40 MG tablet TAKE ONE TABLET BY MOUTH AT BEDTIME  90 tablet  2  . ramipril (ALTACE) 5 MG capsule TAKE ONE CAPSULE BY MOUTH EVERY DAY  90 capsule  2  . vitamin C (ASCORBIC ACID) 500 MG tablet Take 500 mg by mouth daily.         No current facility-administered medications for this visit.    No Known Allergies  Past Medical History  Diagnosis Date  . Cerebrovascular disease   . Premature ventricular contractions   . Hyperlipidemia   . Coronary artery disease   . Hx of colonic polyps   . GERD (gastroesophageal reflux disease)   . Hypertension   . PSA elevation 02/20/10    PSA=1.7 with [name]    Past Surgical History  Procedure Laterality Date  . Coronary artery bypass graft  August 1996    LIMA to the LAD and diagonal, saphenous vein graft to the intermediate, obtuse marginale and circumflex, and a SVG to the RCA and PDA  . Appendectomy    . Tonsillectomy    . Cholecystectomy  Nov 2011    History  Smoking  status  . Never Smoker   Smokeless tobacco  . Never Used    History  Alcohol Use No    Family History  Problem Relation Age of Onset  . Prostate cancer      1st degree relative <50  . Cancer Other     1st degree relative    Review of Systems: The review of systems is per the HPI.  All other systems were reviewed and are negative.  Physical Exam: BP 110/70  Pulse 68  Ht 5\' 11"  (1.803 m)  Wt 195 lb 12.8 oz (88.814 kg)  BMI 27.32 kg/m2 [name] and in no acute distress. Skin is warm and dry. Color is normal.  HEENT is unremarkable but has an abrasion on his lip and nose. Normocephalic/atraumatic. PERRL. Sclera are nonicteric. Neck is supple. No masses. No JVD. Lungs are clear. Cardiac exam shows a regular rate and rhythm. Abdomen is soft. Extremities are without edema. Gait and ROM are intact. No gross neurologic deficits noted.  LABORATORY DATA:  EKG today shows sinus with RBBB and left  anterior fascicular block   Lab Results  Component Value Date   WBC 5.6 08/07/2010   HGB 15.5 08/07/2010   HCT 45.7 08/07/2010   PLT 124* 08/07/2010   GLUCOSE 100* 06/15/2013   CHOL 120 06/15/2013   TRIG 75.0 06/15/2013   HDL 44.80 06/15/2013   LDLCALC 60 06/15/2013   ALT 14 06/15/2013   AST 25 06/15/2013   NA 138 06/15/2013   K 4.2 06/15/2013   CL 105 06/15/2013   CREATININE 1.1 06/15/2013   BUN 18 06/15/2013   CO2 29 06/15/2013   Myoview Impression from August 2013  Exercise Capacity: Fair exercise capacity.  BP Response: Hypertensive blood pressure response.  Clinical Symptoms: There is dyspnea.  ECG Impression: No significant ST segment change suggestive of ischemia.  Comparison with Prior Nuclear Study: No images to compare  Overall Impression: Abnormal stress nuclear study with a moderate size, medium intensity, partially reversible inferolateral defect consistent with prior infarct and very mild peri-infarct ischemia.  LV Ejection Fraction: 59%. LV Wall Motion: Inferolateral hypokinesis.  [name]   Assessment / Plan: 1. Atypical chest pain - sounds more GI but also noting more fatigue - will update his Myoview.   2. Known CAD with remote CABG - last Myoview in 2013 - managed medically - we will plan to update the Myoview. For now, continue with his current regimen.   3. HTN - BP looks great  4. HLD - recheck fasting labs today.  Patient is agreeable to this plan and will call if any problems develop in the interim.   Darren Junes, RN, [facility] [address] [place], [place]  [id number] [phone]

## 2013-12-25 ENCOUNTER — Ambulatory Visit (HOSPITAL_COMMUNITY): Payer: Managed Care, Other (non HMO) | Attending: Nurse Practitioner | Admitting: Radiology

## 2013-12-25 VITALS — BP 121/66 | HR 63 | Ht 71.0 in | Wt 188.0 lb

## 2013-12-25 DIAGNOSIS — R0600 Dyspnea, unspecified: Secondary | ICD-10-CM

## 2013-12-25 DIAGNOSIS — Z951 Presence of aortocoronary bypass graft: Secondary | ICD-10-CM | POA: Insufficient documentation

## 2013-12-25 DIAGNOSIS — I2510 Atherosclerotic heart disease of native coronary artery without angina pectoris: Secondary | ICD-10-CM

## 2013-12-25 DIAGNOSIS — R0609 Other forms of dyspnea: Secondary | ICD-10-CM | POA: Insufficient documentation

## 2013-12-25 DIAGNOSIS — I10 Essential (primary) hypertension: Secondary | ICD-10-CM | POA: Insufficient documentation

## 2013-12-25 DIAGNOSIS — R079 Chest pain, unspecified: Secondary | ICD-10-CM

## 2013-12-25 DIAGNOSIS — R5383 Other fatigue: Secondary | ICD-10-CM

## 2013-12-25 DIAGNOSIS — I252 Old myocardial infarction: Secondary | ICD-10-CM | POA: Insufficient documentation

## 2013-12-25 DIAGNOSIS — I679 Cerebrovascular disease, unspecified: Secondary | ICD-10-CM

## 2013-12-25 DIAGNOSIS — R0989 Other specified symptoms and signs involving the circulatory and respiratory systems: Secondary | ICD-10-CM | POA: Insufficient documentation

## 2013-12-25 DIAGNOSIS — R5381 Other malaise: Secondary | ICD-10-CM | POA: Insufficient documentation

## 2013-12-25 MED ORDER — TECHNETIUM TC 99M SESTAMIBI GENERIC - CARDIOLITE
33.0000 | Freq: Once | INTRAVENOUS | Status: AC | PRN
  Administered 2013-12-25: 33 via INTRAVENOUS

## 2013-12-25 MED ORDER — TECHNETIUM TC 99M SESTAMIBI GENERIC - CARDIOLITE
11.0000 | Freq: Once | INTRAVENOUS | Status: AC | PRN
  Administered 2013-12-25: 11 via INTRAVENOUS

## 2013-12-25 NOTE — Progress Notes (Signed)
[facility] [place], [place] [address] [phone]    Cardiology Nuclear Med Study  [name] is a 80 y.o. male     MRN : [id number]     DOB: 1932/07/25  Procedure Date: 12/25/2013  Nuclear Med Background Indication for Stress Test:  Evaluation for Ischemia and Graft Patency History:  CAD, MI, Cath, CABG, Echo, MPI 2013 EF 59% (mild ischemia, scar) Cardiac Risk Factors: Carotid Disease, Hypertension, Lipids and RBBB  Symptoms:  Chest Pain, DOE and Fatigue   Nuclear Pre-Procedure Caffeine/Decaff Intake:  None >12 hrs NPO After: 7:00pm   Lungs:  clear O2 Sat: 98% on room air. IV 0.9% NS with Angio Cath:  22g  IV Site: R Hand x 1, tolerated well IV Started by:  Irven Baltimore, RN  Chest Size (in):  44 Cup Size: n/a  Height: 5\' 11"  (1.803 m)  Weight:  188 lb (85.276 kg)  BMI:  Body mass index is 26.23 kg/(m^2). Tech Comments:  Took morning medications.    Nuclear Med Study 1 or 2 day study: 1 day  Stress Test Type:  Stress  Reading MD: N/A  Order Authorizing Provider:  Kirk Ruths, MD, and Truitt Merle, NP  Resting Radionuclide: Technetium 47m Sestamibi  Resting Radionuclide Dose: 11.0 mCi   Stress Radionuclide:  Technetium 65m Sestamibi  Stress Radionuclide Dose: 33.0 mCi           Stress Protocol Rest HR: 63 Stress HR: 130  Rest BP: 121/66 Stress BP: 208/106  Exercise Time (min): 6:00 METS: 7.0           Dose of Adenosine (mg):  n/a Dose of Lexiscan: n/a mg  Dose of Atropine (mg): n/a Dose of Dobutamine: n/a mcg/kg/min (at max HR)  Stress Test Technologist: Glade Lloyd, BS-ES  Nuclear Technologist:  Charlton Amor, CNMT     Rest Procedure:  Myocardial perfusion imaging was performed at rest 45 minutes following the intravenous administration of Technetium 64m Sestamibi. Rest ECG: NSR-RBBB  Stress Procedure:  The patient exercised on the treadmill utilizing the Bruce Protocol for 6:00 minutes. The patient  stopped due to fatigue and denied any chest pain.  Technetium 65m Sestamibi was injected at peak exercise and myocardial perfusion imaging was performed after a brief delay. Stress ECG: No significant change from baseline ECG  QPS Raw Data Images:  Normal; no motion artifact; normal heart/lung ratio. Stress Images:  There is a medium sized, severe defecty in the inferior lateral wall.  The uptake in the other walls is normal .   Rest Images:  There is a medium sized, severe defecty in the inferior lateral wall.  The uptake in the other walls is normal  Subtraction (SDS):  No evidence of ischemia.  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  Transient Ischemic Dilatation (Normal <1.22):  0.91 Lung/Heart Ratio (Normal <0.45):  0.31  Quantitative Gated Spect Images QGS EDV:  75 ml QGS ESV:  28 ml  Impression Exercise Capacity:  Good exercise capacity. BP Response:  Normal blood pressure response. Clinical Symptoms:  No significant symptoms noted. ECG Impression:  No significant ST segment change suggestive of ischemia. Comparison with Prior Nuclear Study: No significant change from previous study on 06/02/12.  Overall Impression:  Low risk stress nuclear study .  there is evidence of a previous Inferior lateral MI.  There are no signnificant changes from the previous study.  .  LV Ejection Fraction: 62%.  LV Wall Motion:  mild hypokinesis of inferior lateral wall.  The overall LV function is normal.     Thayer Headings, Brooke Bonito., MD, [facility] 12/25/2013, 5:54 PM Office - [phone] Pager [phone]

## 2013-12-26 ENCOUNTER — Ambulatory Visit: Payer: Managed Care, Other (non HMO) | Admitting: Nurse Practitioner

## 2014-01-16 ENCOUNTER — Other Ambulatory Visit: Payer: Self-pay | Admitting: *Deleted

## 2014-01-16 DIAGNOSIS — K219 Gastro-esophageal reflux disease without esophagitis: Secondary | ICD-10-CM

## 2014-01-16 MED ORDER — ESOMEPRAZOLE MAGNESIUM 40 MG PO CPDR: 40.0000 mg | DELAYED_RELEASE_CAPSULE | Freq: Every day | ORAL | Status: DC

## 2014-04-30 ENCOUNTER — Ambulatory Visit (INDEPENDENT_AMBULATORY_CARE_PROVIDER_SITE_OTHER): Payer: Medicare HMO | Admitting: Family Medicine

## 2014-04-30 VITALS — BP 126/84 | HR 65 | Temp 97.7°F | Resp 16 | Ht 70.0 in | Wt 193.6 lb

## 2014-04-30 DIAGNOSIS — J329 Chronic sinusitis, unspecified: Secondary | ICD-10-CM

## 2014-04-30 DIAGNOSIS — R0982 Postnasal drip: Secondary | ICD-10-CM

## 2014-04-30 DIAGNOSIS — J029 Acute pharyngitis, unspecified: Secondary | ICD-10-CM

## 2014-04-30 LAB — POCT RAPID STREP A (OFFICE): Rapid Strep A Screen: NEGATIVE

## 2014-04-30 MED ORDER — AMOXICILLIN 875 MG PO TABS: 875.0000 mg | ORAL_TABLET | Freq: Two times a day (BID) | ORAL | Status: DC

## 2014-04-30 NOTE — Progress Notes (Signed)
Subjective: For about a month the patient has been having a sore throat. It's worse in the morning and bothers him less as the day goes on. No infection symptoms. No running nose or cough. No ear pains. No known allergies. He has not really taken anything for it. Today was worse when he came on in.  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  Objective: Pleasant gentleman no acute distress. TMs normal. Throat has a little erythema) the uvula and there is a small strand of yellow mucus hanging down along the back of the throat. Neck supple without significant nodes. Chest clear. Heart regular without murmurs.  Results for orders placed in visit on 04/30/14  POCT RAPID STREP A (OFFICE)      Result Value Ref Range   Rapid Strep A Screen Negative  Negative    Assessment: Probably has a posterior sinusitis causing the postnasal drainage and sore throat  Plan: Amoxicillin OTC Allegra or loratadine  Return if further problem

## 2014-04-30 NOTE — Patient Instructions (Signed)
Take amoxicillin 875 one twice daily  Take over-the-counter Allegra (fexofenadine) or Claritin (loratadine) one daily for allergy to try and reduce the drainage  Return if sore throat persists

## 2014-05-03 LAB — CULTURE, GROUP A STREP: ORGANISM ID, BACTERIA: NORMAL

## 2014-05-15 ENCOUNTER — Other Ambulatory Visit: Payer: Self-pay | Admitting: Cardiology

## 2014-06-08 ENCOUNTER — Encounter: Payer: Self-pay | Admitting: Cardiology

## 2014-06-08 ENCOUNTER — Ambulatory Visit (INDEPENDENT_AMBULATORY_CARE_PROVIDER_SITE_OTHER): Payer: Managed Care, Other (non HMO) | Admitting: Cardiology

## 2014-06-08 VITALS — BP 120/78 | HR 69 | Ht 71.0 in | Wt 196.8 lb

## 2014-06-08 DIAGNOSIS — I2510 Atherosclerotic heart disease of native coronary artery without angina pectoris: Secondary | ICD-10-CM

## 2014-06-08 DIAGNOSIS — E785 Hyperlipidemia, unspecified: Secondary | ICD-10-CM

## 2014-06-08 DIAGNOSIS — I679 Cerebrovascular disease, unspecified: Secondary | ICD-10-CM

## 2014-06-08 DIAGNOSIS — I10 Essential (primary) hypertension: Secondary | ICD-10-CM

## 2014-06-08 NOTE — Assessment & Plan Note (Signed)
Continue aspirin and statin. 

## 2014-06-08 NOTE — Patient Instructions (Signed)
Your physician wants you to follow-up in: ONE YEAR WITH DR CRENSHAW You will receive a reminder letter in the mail two months in advance. If you don't receive a letter, please call our office to schedule the follow-up appointment.  

## 2014-06-08 NOTE — Assessment & Plan Note (Signed)
Mild on previous carotid Dopplers. Continue aspirin and statin. 

## 2014-06-08 NOTE — Progress Notes (Signed)
HPI: FU coronary artery disease status post coronary artery bypassing graft. Abd ultrasound 9/14 showed no aneurysm. Carotid Dopplers performed in Feb 2013 revealed 0-39% bilateral stenosis. His last Myoview was performed in March 2015. His ejection fraction was preserved at 62%. Prior inferolateral infarct; no ischemia.  Since I last saw him, the patient has dyspnea with more extreme activities but not with routine activities. It is relieved with rest. It is not associated with chest pain. There is no orthopnea, PND or pedal edema. There is no syncope or palpitations. There is no exertional chest pain.    Current Outpatient Prescriptions  Medication Sig Dispense Refill  . aspirin 81 MG tablet Take 81 mg by mouth daily.        . Calcium 600-200 MG-UNIT per tablet Take 1 tablet by mouth daily.        Marland Kitchen esomeprazole (NEXIUM) 40 MG capsule Take 1 capsule (40 mg total) by mouth daily.  90 capsule  3  . multivitamin (THERAGRAN) per tablet Take 1 tablet by mouth daily.        . niacin (NIASPAN) 1000 MG CR tablet TAKE ONE TABLET BY MOUTH AT BEDTIME  90 tablet  0  . pravastatin (PRAVACHOL) 40 MG tablet TAKE ONE TABLET BY MOUTH AT BEDTIME  90 tablet  0  . ramipril (ALTACE) 5 MG capsule TAKE ONE CAPSULE BY MOUTH EVERY DAY  90 capsule  2  . vitamin C (ASCORBIC ACID) 500 MG tablet Take 500 mg by mouth daily.         No current facility-administered medications for this visit.     Past Medical History  Diagnosis Date  . Cerebrovascular disease   . Premature ventricular contractions   . Hyperlipidemia   . Coronary artery disease   . Hx of colonic polyps   . GERD (gastroesophageal reflux disease)   . Hypertension   . PSA elevation 02/20/10    PSA=1.7 with Dr. Rosana Hoes    Past Surgical History  Procedure Laterality Date  . Coronary artery bypass graft  August 1996    LIMA to the LAD and diagonal, saphenous vein graft to the intermediate, obtuse marginale and circumflex, and a SVG to the RCA  and PDA  . Appendectomy    . Tonsillectomy    . Cholecystectomy  Nov 2011    History   Social History  . Marital Status: Widowed    Spouse Name: N/A    Number of Children: N/A  . Years of Education: N/A   Occupational History  . Retired    Social History Main Topics  . Smoking status: Never Smoker   . Smokeless tobacco: Never Used  . Alcohol Use: No  . Drug Use: No  . Sexual Activity: Not Currently   Other Topics Concern  . Not on file   Social History Narrative   Gets regular exercise.    ROS: no fevers or chills, productive cough, hemoptysis, dysphasia, odynophagia, melena, hematochezia, dysuria, hematuria, rash, seizure activity, orthopnea, PND, pedal edema, claudication. Remaining systems are negative.  Physical Exam: Well-developed well-nourished in no acute distress.  Skin is warm and dry.  HEENT is normal.  Neck is supple.  Chest is clear to auscultation with normal expansion.  Cardiovascular exam is regular rate and rhythm.  Abdominal exam nontender or distended. No masses palpated. Extremities show no edema. neuro grossly intact  ECG Sinus rhythm with occasional PVC. Right bundle branch block. Left anterior fascicular block.

## 2014-06-08 NOTE — Assessment & Plan Note (Signed)
Blood pressure controlled. Continue present medications. 

## 2014-06-08 NOTE — Assessment & Plan Note (Signed)
Continue statin. 

## 2014-06-26 ENCOUNTER — Ambulatory Visit: Payer: Managed Care, Other (non HMO) | Admitting: Cardiology

## 2014-07-28 ENCOUNTER — Other Ambulatory Visit: Payer: Self-pay

## 2014-07-28 MED ORDER — RAMIPRIL 5 MG PO CAPS: 5.0000 mg | ORAL_CAPSULE | Freq: Every day | ORAL | Status: DC

## 2014-07-28 MED ORDER — NIACIN ER (ANTIHYPERLIPIDEMIC) 1000 MG PO TBCR: 1000.0000 mg | EXTENDED_RELEASE_TABLET | Freq: Every day | ORAL | Status: DC

## 2014-07-30 ENCOUNTER — Other Ambulatory Visit: Payer: Self-pay

## 2014-07-30 MED ORDER — PRAVASTATIN SODIUM 40 MG PO TABS: ORAL_TABLET | ORAL | Status: DC

## 2015-05-09 NOTE — Progress Notes (Signed)
      HPI: FU coronary artery disease status post coronary artery bypassing graft. Carotid Dopplers performed in Feb 2013 revealed 0-39% bilateral stenosis. Abd ultrasound 9/14 showed no aneurysm. Nuclear study 3/15 showed EF 62, inferior lateral infarct; no ischemia. Since I last saw him, the patient has dyspnea with more extreme activities but not with routine activities. It is relieved with rest. It is not associated with chest pain. There is no orthopnea, PND or pedal edema. There is no syncope or palpitations. There is no exertional chest pain.   Current Outpatient Prescriptions  Medication Sig Dispense Refill  . aspirin 81 MG tablet Take 81 mg by mouth daily.      . Calcium 600-200 MG-UNIT per tablet Take 1 tablet by mouth daily.      Marland Kitchen esomeprazole (NEXIUM) 40 MG capsule Take 1 capsule (40 mg total) by mouth daily. 90 capsule 3  . multivitamin (THERAGRAN) per tablet Take 1 tablet by mouth daily.      . pravastatin (PRAVACHOL) 80 MG tablet TAKE ONE TABLET BY MOUTH AT BEDTIME 90 tablet 3  . ramipril (ALTACE) 5 MG capsule Take 1 capsule (5 mg total) by mouth daily. 90 capsule 2  . vitamin C (ASCORBIC ACID) 500 MG tablet Take 500 mg by mouth daily.       No current facility-administered medications for this visit.     Past Medical History  Diagnosis Date  . Cerebrovascular disease   . Premature ventricular contractions   . Hyperlipidemia   . Coronary artery disease   . Hx of colonic polyps   . GERD (gastroesophageal reflux disease)   . Hypertension   . PSA elevation 02/20/10    PSA=1.7 with Dr. Rosana Hoes    Past Surgical History  Procedure Laterality Date  . Coronary artery bypass graft  August 1996    LIMA to the LAD and diagonal, saphenous vein graft to the intermediate, obtuse marginale and circumflex, and a SVG to the RCA and PDA  . Appendectomy    . Tonsillectomy    . Cholecystectomy  Nov 2011    History   Social History  . Marital Status: Widowed    Spouse Name: N/A   . Number of Children: N/A  . Years of Education: N/A   Occupational History  . Retired    Social History Main Topics  . Smoking status: Never Smoker   . Smokeless tobacco: Never Used  . Alcohol Use: No  . Drug Use: No  . Sexual Activity: Not Currently   Other Topics Concern  . Not on file   Social History Narrative   Gets regular exercise.    ROS: no fevers or chills, productive cough, hemoptysis, dysphasia, odynophagia, melena, hematochezia, dysuria, hematuria, rash, seizure activity, orthopnea, PND, pedal edema, claudication. Remaining systems are negative.  Physical Exam: Well-developed well-nourished in no acute distress.  Skin is warm and dry.  HEENT is normal.  Neck is supple.  Chest is clear to auscultation with normal expansion.  Cardiovascular exam is regular rate and rhythm.  Abdominal exam nontender or distended. No masses palpated. Extremities show no edema. neuro grossly intact  ECG sinus rhythm, left anterior fascicular block, right bundle branch block.

## 2015-05-13 ENCOUNTER — Ambulatory Visit (INDEPENDENT_AMBULATORY_CARE_PROVIDER_SITE_OTHER): Payer: Medicare HMO | Admitting: Cardiology

## 2015-05-13 ENCOUNTER — Encounter: Payer: Self-pay | Admitting: Cardiology

## 2015-05-13 VITALS — BP 102/68 | HR 81 | Ht 71.0 in | Wt 207.0 lb

## 2015-05-13 DIAGNOSIS — E785 Hyperlipidemia, unspecified: Secondary | ICD-10-CM

## 2015-05-13 DIAGNOSIS — I10 Essential (primary) hypertension: Secondary | ICD-10-CM | POA: Diagnosis not present

## 2015-05-13 MED ORDER — PRAVASTATIN SODIUM 80 MG PO TABS: ORAL_TABLET | ORAL | Status: DC

## 2015-05-13 MED ORDER — RAMIPRIL 5 MG PO CAPS: 5.0000 mg | ORAL_CAPSULE | Freq: Every day | ORAL | Status: DC

## 2015-05-13 NOTE — Assessment & Plan Note (Signed)
Continue aspirin and statin. 

## 2015-05-13 NOTE — Assessment & Plan Note (Signed)
Given most recent recommendations I will discontinue Niaspan. Increase Pravachol to 80 mg daily. Check lipids and liver in 4 weeks.

## 2015-05-13 NOTE — Assessment & Plan Note (Signed)
Blood pressure controlled. Continue present medications. Check potassium and renal function. 

## 2015-05-13 NOTE — Patient Instructions (Signed)
Your physician wants you to follow-up in: [place] will receive a reminder letter in the mail two months in advance. If you don't receive a letter, please call our office to schedule the follow-up appointment.   STOP NIACIN  INCREASE PRAVASTATIN TO 80 MG ONCE DAILY  Your physician recommends that you return for lab work in: 4 WEEKS= DO NOT EAT PRIOR TO LAB WORK

## 2015-05-13 NOTE — Addendum Note (Signed)
Addended by: Cristopher Estimable on: 05/13/2015 02:11 PM   Modules accepted: Orders

## 2015-05-23 ENCOUNTER — Telehealth: Payer: Self-pay | Admitting: Cardiology

## 2015-05-23 DIAGNOSIS — E785 Hyperlipidemia, unspecified: Secondary | ICD-10-CM

## 2015-05-23 MED ORDER — PRAVASTATIN SODIUM 40 MG PO TABS: ORAL_TABLET | ORAL | Status: DC

## 2015-05-23 NOTE — Telephone Encounter (Signed)
Spoke with pt, pravastatin was increased at last appointment. He noticed he had dizziness after increasing the dose first thing in the morning. He did not take it and did not have the problem. He reports he tolerated the 40 mg dosage. Pt to decrease pravastatin back to 40 mg once daily. He will call with problems.

## 2015-05-23 NOTE — Telephone Encounter (Signed)
Please call,seems to be having some side effects from the Pravastatin.

## 2015-05-27 ENCOUNTER — Telehealth: Payer: Self-pay | Admitting: Cardiology

## 2015-05-27 MED ORDER — ATORVASTATIN CALCIUM 20 MG PO TABS: 20.0000 mg | ORAL_TABLET | Freq: Every day | ORAL | Status: DC

## 2015-05-27 NOTE — Telephone Encounter (Signed)
Left message for pt to call.

## 2015-05-27 NOTE — Telephone Encounter (Signed)
Pt called in stating that he is still having some dizziness. He also states that he called in and spoke to [place] a few days ago about this and she adjusted his meds, he says that did not help and would like to come in and he seen as soon as possible. Please call  Thanks

## 2015-05-27 NOTE — Telephone Encounter (Signed)
Spoke with pt, he has been taking the pravastatin 40 mg and cont to have dizziness. He had stopped it and felt better. The dizziness occurs 1st thing in the morning when sitting up from lying. It does not bother him any other time. He reports his bp is running good. Okay given for pt to stop the pravastatin. Will forward for dr Stanford Breed review for consideration of another stain to replace the pravastatin

## 2015-05-27 NOTE — Telephone Encounter (Signed)
Spoke with pt, Aware of dr crenshaw's recommendations. New script sent to the pharmacy  

## 2015-05-27 NOTE — Telephone Encounter (Signed)
Try lipitor 20 mg daily, lipids and liver 6 weeks Kirk [facility]s

## 2015-07-19 DIAGNOSIS — E785 Hyperlipidemia, unspecified: Secondary | ICD-10-CM | POA: Diagnosis not present

## 2015-07-19 DIAGNOSIS — R351 Nocturia: Secondary | ICD-10-CM | POA: Diagnosis not present

## 2015-07-19 DIAGNOSIS — N401 Enlarged prostate with lower urinary tract symptoms: Secondary | ICD-10-CM | POA: Diagnosis not present

## 2015-07-19 DIAGNOSIS — Z8042 Family history of malignant neoplasm of prostate: Secondary | ICD-10-CM | POA: Diagnosis not present

## 2015-07-19 DIAGNOSIS — I10 Essential (primary) hypertension: Secondary | ICD-10-CM | POA: Diagnosis not present

## 2015-07-20 LAB — BASIC METABOLIC PANEL
BUN: 18 mg/dL (ref 7–25)
CHLORIDE: 106 mmol/L (ref 98–110)
CO2: 28 mmol/L (ref 20–31)
Calcium: 9.3 mg/dL (ref 8.6–10.3)
Creat: 1.07 mg/dL (ref 0.70–1.11)
Glucose, Bld: 95 mg/dL (ref 65–99)
POTASSIUM: 4.8 mmol/L (ref 3.5–5.3)
Sodium: 141 mmol/L (ref 135–146)

## 2015-07-20 LAB — HEPATIC FUNCTION PANEL
ALBUMIN: 3.9 g/dL (ref 3.6–5.1)
ALT: 13 U/L (ref 9–46)
AST: 18 U/L (ref 10–35)
Alkaline Phosphatase: 65 U/L (ref 40–115)
BILIRUBIN DIRECT: 0.2 mg/dL (ref <=0.2)
BILIRUBIN TOTAL: 0.7 mg/dL (ref 0.2–1.2)
Indirect Bilirubin: 0.5 mg/dL (ref 0.2–1.2)
Total Protein: 6.6 g/dL (ref 6.1–8.1)

## 2015-07-20 LAB — LIPID PANEL
Cholesterol: 119 mg/dL — ABNORMAL LOW (ref 125–200)
HDL: 38 mg/dL — AB (ref >=40)
LDL CALC: 62 mg/dL (ref <130)
TRIGLYCERIDES: 94 mg/dL (ref <150)
Total CHOL/HDL Ratio: 3.1 Ratio (ref <=5.0)
VLDL: 19 mg/dL (ref <30)

## 2015-07-22 ENCOUNTER — Encounter: Payer: Self-pay | Admitting: *Deleted

## 2015-08-27 DIAGNOSIS — Z23 Encounter for immunization: Secondary | ICD-10-CM | POA: Diagnosis not present

## 2015-10-03 DIAGNOSIS — H903 Sensorineural hearing loss, bilateral: Secondary | ICD-10-CM | POA: Diagnosis not present

## 2015-10-03 DIAGNOSIS — H9313 Tinnitus, bilateral: Secondary | ICD-10-CM | POA: Diagnosis not present

## 2016-01-09 NOTE — Progress Notes (Signed)
HPI: FU coronary artery disease status post coronary artery bypassing graft. Carotid Dopplers performed in Feb 2013 revealed 0-39% bilateral stenosis. Abd ultrasound 9/14 showed no aneurysm. Nuclear study 3/15 showed EF 62, inferior lateral infarct; no ischemia. Since I last saw him, He has dyspnea with moderate activities. Not routine activities. No orthopnea, PND, pedal edema, chest pain or syncope. He does have spells when he feels somewhat dizzy for an hour at a time in the mornings. It can be with sitting and standing. No syncope or associated palpitations.  Current Outpatient Prescriptions  Medication Sig Dispense Refill  . aspirin 81 MG tablet Take 81 mg by mouth daily.      Marland Kitchen atorvastatin (LIPITOR) 20 MG tablet Take 1 tablet (20 mg total) by mouth daily. 30 tablet 12  . Calcium 600-200 MG-UNIT per tablet Take 1 tablet by mouth daily.      Marland Kitchen esomeprazole (NEXIUM) 40 MG capsule Take 1 capsule (40 mg total) by mouth daily. 90 capsule 3  . multivitamin (THERAGRAN) per tablet Take 1 tablet by mouth daily.      . ramipril (ALTACE) 5 MG capsule Take 1 capsule (5 mg total) by mouth daily. 90 capsule 2  . vitamin C (ASCORBIC ACID) 500 MG tablet Take 500 mg by mouth daily.       No current facility-administered medications for this visit.     Past Medical History  Diagnosis Date  . Cerebrovascular disease   . Premature ventricular contractions   . Hyperlipidemia   . Coronary artery disease   . Hx of colonic polyps   . GERD (gastroesophageal reflux disease)   . Hypertension   . PSA elevation 02/20/10    PSA=1.7 with Dr. Rosana Hoes    Past Surgical History  Procedure Laterality Date  . Coronary artery bypass graft  August 1996    LIMA to the LAD and diagonal, saphenous vein graft to the intermediate, obtuse marginale and circumflex, and a SVG to the RCA and PDA  . Appendectomy    . Tonsillectomy    . Cholecystectomy  Nov 2011    Social History   Social History  . Marital  Status: Widowed    Spouse Name: N/A  . Number of Children: N/A  . Years of Education: N/A   Occupational History  . Retired    Social History Main Topics  . Smoking status: Never Smoker   . Smokeless tobacco: Never Used  . Alcohol Use: No  . Drug Use: No  . Sexual Activity: Not Currently   Other Topics Concern  . Not on file   Social History Narrative   Gets regular exercise.    Family History  Problem Relation Age of Onset  . Prostate cancer      1st degree relative <50  . Cancer Other     1st degree relative    ROS: no fevers or chills, productive cough, hemoptysis, dysphasia, odynophagia, melena, hematochezia, dysuria, hematuria, rash, seizure activity, orthopnea, PND, pedal edema, claudication. Remaining systems are negative.  Physical Exam: Well-developed well-nourished in no acute distress.  Skin is warm and dry.  HEENT is normal.  Neck is supple.  Chest is clear to auscultation with normal expansion.  Cardiovascular exam is regular rate and rhythm.  Abdominal exam nontender or distended. No masses palpated. Extremities show no edema. neuro grossly intact  ECG Sinus rhythm at a rate of 77. Left anterior fascicular block, right bundle branch block, left ventricular hypertrophy

## 2016-01-13 ENCOUNTER — Encounter: Payer: Self-pay | Admitting: Cardiology

## 2016-01-13 ENCOUNTER — Ambulatory Visit (INDEPENDENT_AMBULATORY_CARE_PROVIDER_SITE_OTHER): Payer: Medicare HMO | Admitting: Cardiology

## 2016-01-13 VITALS — BP 128/72 | HR 76 | Ht 71.0 in | Wt 206.5 lb

## 2016-01-13 DIAGNOSIS — I10 Essential (primary) hypertension: Secondary | ICD-10-CM

## 2016-01-13 DIAGNOSIS — I679 Cerebrovascular disease, unspecified: Secondary | ICD-10-CM | POA: Diagnosis not present

## 2016-01-13 DIAGNOSIS — I2510 Atherosclerotic heart disease of native coronary artery without angina pectoris: Secondary | ICD-10-CM | POA: Diagnosis not present

## 2016-01-13 NOTE — Assessment & Plan Note (Addendum)
Blood pressure controlled. Continue present medications. He is having some dizziness at times but it can last an hour at a time. I have asked him to check his pulse and blood pressure during those episodes. If his blood pressure was low we could discontinue his medications. His episodes are more frequent in the morning and resolve in the afternoon. He notices these prior to taking his medications and I think hypotension is unlikely but worth considering.

## 2016-01-13 NOTE — Assessment & Plan Note (Addendum)
Continue aspirin and statin. Repeat carotid Dopplers. 

## 2016-01-13 NOTE — Patient Instructions (Signed)
Medication Instructions:   NO CHANGE  Testing/Procedures:  Your physician has requested that you have en exercise stress myoview. For further information please visit [email or web address]. Please follow instruction sheet, as given.   Your physician has requested that you have a carotid duplex. This test is an ultrasound of the carotid arteries in your neck. It looks at blood flow through these arteries that supply the brain with blood. Allow one hour for this exam. There are no restrictions or special instructions.    Follow-Up:  Your physician wants you to follow-up in: 3 [place] will receive a reminder letter in the mail two months in advance. If you don't receive a letter, please call our office to schedule the follow-up appointment.

## 2016-01-13 NOTE — Assessment & Plan Note (Signed)
Continue statin. 

## 2016-01-13 NOTE — Assessment & Plan Note (Addendum)
Continue aspirin and statin. Scheduled nuclear study for risk stratification.

## 2016-01-16 NOTE — Addendum Note (Signed)
Addended by: Therisa Doyne on: 01/16/2016 05:13 PM   Modules accepted: Orders

## 2016-01-23 ENCOUNTER — Telehealth (HOSPITAL_COMMUNITY): Payer: Self-pay

## 2016-01-23 NOTE — Telephone Encounter (Signed)
Encounter complete. 

## 2016-01-24 ENCOUNTER — Other Ambulatory Visit: Payer: Self-pay | Admitting: Cardiology

## 2016-01-24 DIAGNOSIS — I679 Cerebrovascular disease, unspecified: Secondary | ICD-10-CM

## 2016-01-24 DIAGNOSIS — I6523 Occlusion and stenosis of bilateral carotid arteries: Secondary | ICD-10-CM

## 2016-01-28 ENCOUNTER — Ambulatory Visit (HOSPITAL_COMMUNITY)
Admission: RE | Admit: 2016-01-28 | Discharge: 2016-01-28 | Disposition: A | Discharge status: Home / Self Care | Payer: Medicare HMO | Source: Ambulatory Visit | Attending: Cardiology | Admitting: Cardiology

## 2016-01-28 DIAGNOSIS — R5383 Other fatigue: Secondary | ICD-10-CM | POA: Diagnosis not present

## 2016-01-28 DIAGNOSIS — I2510 Atherosclerotic heart disease of native coronary artery without angina pectoris: Secondary | ICD-10-CM | POA: Diagnosis not present

## 2016-01-28 DIAGNOSIS — R0609 Other forms of dyspnea: Secondary | ICD-10-CM | POA: Insufficient documentation

## 2016-01-28 DIAGNOSIS — I4510 Unspecified right bundle-branch block: Secondary | ICD-10-CM | POA: Insufficient documentation

## 2016-01-28 DIAGNOSIS — I739 Peripheral vascular disease, unspecified: Secondary | ICD-10-CM | POA: Diagnosis not present

## 2016-01-28 DIAGNOSIS — I10 Essential (primary) hypertension: Secondary | ICD-10-CM | POA: Diagnosis not present

## 2016-01-28 DIAGNOSIS — I6523 Occlusion and stenosis of bilateral carotid arteries: Secondary | ICD-10-CM

## 2016-01-28 DIAGNOSIS — E663 Overweight: Secondary | ICD-10-CM | POA: Diagnosis not present

## 2016-01-28 DIAGNOSIS — R9439 Abnormal result of other cardiovascular function study: Secondary | ICD-10-CM | POA: Insufficient documentation

## 2016-01-28 DIAGNOSIS — Z6828 Body mass index (BMI) 28.0-28.9, adult: Secondary | ICD-10-CM | POA: Insufficient documentation

## 2016-01-28 DIAGNOSIS — I679 Cerebrovascular disease, unspecified: Secondary | ICD-10-CM | POA: Diagnosis not present

## 2016-01-28 DIAGNOSIS — R42 Dizziness and giddiness: Secondary | ICD-10-CM | POA: Diagnosis not present

## 2016-01-28 LAB — MYOCARDIAL PERFUSION IMAGING
CHL CUP MPHR: 139 {beats}/min
CHL RATE OF PERCEIVED EXERTION: 17
CSEPED: 5 min
CSEPEDS: 15 s
Estimated workload: 7 METS
LV sys vol: 59 mL
LVDIAVOL: 114 mL (ref 62–150)
NUC STRESS TID: 1.22
Peak HR: 127 {beats}/min
Percent HR: 91 %
Rest HR: 58 {beats}/min
SDS: 6
SRS: 8
SSS: 14

## 2016-01-28 MED ORDER — TECHNETIUM TC 99M SESTAMIBI GENERIC - CARDIOLITE
30.9000 | Freq: Once | INTRAVENOUS | Status: AC | PRN
  Administered 2016-01-28: 30.9 via INTRAVENOUS

## 2016-01-28 MED ORDER — TECHNETIUM TC 99M SESTAMIBI GENERIC - CARDIOLITE
9.9000 | Freq: Once | INTRAVENOUS | Status: AC | PRN
  Administered 2016-01-28: 9.9 via INTRAVENOUS

## 2016-02-03 ENCOUNTER — Emergency Department (HOSPITAL_COMMUNITY)
Admission: EM | Admit: 2016-02-03 | Discharge: 2016-02-03 | Disposition: A | Discharge status: Home / Self Care | Payer: No Typology Code available for payment source | Attending: Emergency Medicine | Admitting: Emergency Medicine

## 2016-02-03 ENCOUNTER — Telehealth: Payer: Self-pay | Admitting: Cardiology

## 2016-02-03 ENCOUNTER — Encounter (HOSPITAL_COMMUNITY): Payer: Self-pay | Admitting: Emergency Medicine

## 2016-02-03 ENCOUNTER — Emergency Department (HOSPITAL_COMMUNITY): Payer: No Typology Code available for payment source

## 2016-02-03 DIAGNOSIS — Z79899 Other long term (current) drug therapy: Secondary | ICD-10-CM | POA: Diagnosis not present

## 2016-02-03 DIAGNOSIS — M542 Cervicalgia: Secondary | ICD-10-CM | POA: Diagnosis not present

## 2016-02-03 DIAGNOSIS — I679 Cerebrovascular disease, unspecified: Secondary | ICD-10-CM | POA: Diagnosis not present

## 2016-02-03 DIAGNOSIS — I25810 Atherosclerosis of coronary artery bypass graft(s) without angina pectoris: Secondary | ICD-10-CM | POA: Diagnosis not present

## 2016-02-03 DIAGNOSIS — Y999 Unspecified external cause status: Secondary | ICD-10-CM | POA: Insufficient documentation

## 2016-02-03 DIAGNOSIS — K219 Gastro-esophageal reflux disease without esophagitis: Secondary | ICD-10-CM | POA: Insufficient documentation

## 2016-02-03 DIAGNOSIS — Y9389 Activity, other specified: Secondary | ICD-10-CM | POA: Insufficient documentation

## 2016-02-03 DIAGNOSIS — I10 Essential (primary) hypertension: Secondary | ICD-10-CM | POA: Diagnosis not present

## 2016-02-03 DIAGNOSIS — T148 Other injury of unspecified body region: Secondary | ICD-10-CM | POA: Diagnosis not present

## 2016-02-03 DIAGNOSIS — M25519 Pain in unspecified shoulder: Secondary | ICD-10-CM | POA: Diagnosis not present

## 2016-02-03 DIAGNOSIS — S0990XA Unspecified injury of head, initial encounter: Secondary | ICD-10-CM | POA: Diagnosis not present

## 2016-02-03 DIAGNOSIS — Z7982 Long term (current) use of aspirin: Secondary | ICD-10-CM | POA: Diagnosis not present

## 2016-02-03 DIAGNOSIS — E785 Hyperlipidemia, unspecified: Secondary | ICD-10-CM | POA: Diagnosis not present

## 2016-02-03 DIAGNOSIS — S199XXA Unspecified injury of neck, initial encounter: Secondary | ICD-10-CM | POA: Insufficient documentation

## 2016-02-03 DIAGNOSIS — Y92410 Unspecified street and highway as the place of occurrence of the external cause: Secondary | ICD-10-CM | POA: Insufficient documentation

## 2016-02-03 MED ORDER — ACETAMINOPHEN 325 MG PO TABS
650.0000 mg | ORAL_TABLET | Freq: Once | ORAL | Status: AC
  Administered 2016-02-03: 650 mg via ORAL
  Filled 2016-02-03: qty 2

## 2016-02-03 NOTE — Telephone Encounter (Signed)
Returned call to patient. He notes since he bought a BP cuff on 19th, he has gotten higher readings than he thinks are typical for him.  Notes previously his BPs were 120s/60s. This week has obtained values ranging 143-158/79-99.  Advised that BP cuff calibration may need to be considered - continue to check daily, and asked if he would be willing to come in for BP check w/ pharmacist. Pt voiced agreement. Scheduled for Thursday 7:30am at his preference.  Pt instructed to bring cuff and readings to appt.

## 2016-02-03 NOTE — Telephone Encounter (Signed)
Pt c/o BP issue: STAT if pt c/o blurred vision, one-sided weakness or slurred speech  1. What are your last 5 BP readings? 147/92-- 02/03/16 today   2. Are you having any other symptoms (ex. Dizziness, headache, blurred vision, passed out)? no  3. What is your BP issue? BP is high.
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
## 2016-02-04 ENCOUNTER — Telehealth: Payer: Self-pay | Admitting: Cardiology
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
## 2016-02-04 NOTE — Telephone Encounter (Signed)
Agree  Thank you
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
## 2016-02-06 ENCOUNTER — Ambulatory Visit (INDEPENDENT_AMBULATORY_CARE_PROVIDER_SITE_OTHER): Payer: Medicare HMO | Admitting: Pharmacist

## 2016-02-06 VITALS — BP 154/92 | HR 73 | Wt 206.0 lb

## 2016-02-06 DIAGNOSIS — I10 Essential (primary) hypertension: Secondary | ICD-10-CM

## 2016-02-06 NOTE — Patient Instructions (Signed)
Continue to monitor blood pressures at home.   Continue taking ramipril 5mg  and check blood pressure after taking medications.
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
## 2016-03-04 ENCOUNTER — Other Ambulatory Visit: Payer: Self-pay | Admitting: Cardiology

## 2016-03-12 ENCOUNTER — Ambulatory Visit: Payer: Medicare HMO

## 2016-03-23 DIAGNOSIS — H2512 Age-related nuclear cataract, left eye: Secondary | ICD-10-CM | POA: Diagnosis not present

## 2016-03-23 DIAGNOSIS — Z961 Presence of intraocular lens: Secondary | ICD-10-CM | POA: Diagnosis not present

## 2016-03-24 ENCOUNTER — Ambulatory Visit (INDEPENDENT_AMBULATORY_CARE_PROVIDER_SITE_OTHER): Payer: Medicare HMO | Admitting: Pharmacist Clinician (PhC)/ Clinical Pharmacy Specialist

## 2016-03-24 VITALS — BP 164/84 | HR 72 | Ht 71.0 in | Wt 208.4 lb

## 2016-03-24 DIAGNOSIS — I10 Essential (primary) hypertension: Secondary | ICD-10-CM | POA: Diagnosis not present

## 2016-03-24 MED ORDER — RAMIPRIL 10 MG PO CAPS: 10.0000 mg | ORAL_CAPSULE | Freq: Every day | ORAL | Status: DC

## 2016-03-24 NOTE — Progress Notes (Signed)
     03/24/2016 [name] May 31, 1932 [id number]   HPI:  [name] is a 82 y.o. male patient of Dr Stanford Breed, with a PMH below who presents today for hypertension clinic follow up.   He was seen about 6 weeks ago with a slightly elevated pressure.  No changes were made at that time, as he had not yet taken his daily dose of ramipril. He reports no dizziness or lightheadedness with positional change.   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  Blood Pressure Goal:  150/90   Current Medications: ramipril 5 mg qd  Cardiac Hx: CABG; most recent stress test showed EF 48%; hyperlipidemia,   Social Hx: no tobacco or alcohol; does drink caffeine (in sweet tea) daily  Diet: mostly eats out, fast food and sit down.  Does not add salt to his food.  Does not snack or graze between meals  Home BP readings:  Average 144/80; range 104/176-60-99; of 36 readings 11 had systolic > Q000111Q (and 4 = Q000111Q) and 8 diastolic > 90  Intolerances: none  Wt Readings from Last 3 Encounters:  03/24/16 208 lb 6.4 oz (94.53 kg)  02/06/16 206 lb (93.441 kg)  01/28/16 206 lb (93.441 kg)   BP Readings from Last 3 Encounters:  03/24/16 164/84  02/06/16 154/92  02/03/16 175/97   Pulse Readings from Last 3 Encounters:  03/24/16 72  02/06/16 73  02/03/16 72    Current Outpatient Prescriptions  Medication Sig Dispense Refill  . aspirin 81 MG tablet Take 81 mg by mouth daily.      Marland Kitchen atorvastatin (LIPITOR) 20 MG tablet Take 1 tablet (20 mg total) by mouth daily. 30 tablet 12  . Calcium 600-200 MG-UNIT per tablet Take 1 tablet by mouth daily.      Marland Kitchen esomeprazole (NEXIUM) 40 MG capsule Take 1 capsule (40 mg total) by mouth daily. 90 capsule 3  . multivitamin (THERAGRAN) per tablet Take 1 tablet by mouth daily.      . ramipril (ALTACE) 5 MG capsule TAKE ONE CAPSULE BY MOUTH ONCE DAILY 90 capsule 2  . vitamin C (ASCORBIC ACID) 500 MG tablet Take 500 mg by mouth daily.        No current facility-administered medications for this visit.    No Known Allergies  Past Medical History  Diagnosis Date  . Cerebrovascular disease   . Premature ventricular contractions   . Hyperlipidemia   . Coronary artery disease   . Hx of colonic polyps   . GERD (gastroesophageal reflux disease)   . Hypertension   . PSA elevation 02/20/10    PSA=1.7 with Dr. Rosana Hoes    Blood pressure 164/84, pulse 72, height 5\' 11"  (1.803 m), weight 208 lb 6.4 oz (94.53 kg).    Tommy Medal PharmD CPP [facility]

## 2016-03-24 NOTE — Assessment & Plan Note (Signed)
Today his BP is elevated in the office at 164/84.  Because more than 1/3 of his home readings had an elevated number, I am going to increase his ramipril from 5 mg to 10 mg daily.  He is to continue with his regular home BP checks and we will see him again in 6 weeks for follow up.

## 2016-03-24 NOTE — Patient Instructions (Signed)
Return for a a follow up appointment on August 3 at 7:30 am  Your blood pressure today is 134/84   Check your blood pressure at home daily as able, and keep record of the readings.  Take your BP meds as follows: increase ramipril to 10 mg once daily  Bring all of your meds, your BP cuff and your record of home blood pressures to your next appointment.  Exercise as you're able, try to walk approximately 30 minutes per day.  Keep salt intake to a minimum, especially watch canned and prepared boxed foods.  Eat more fresh fruits and vegetables and fewer canned items.  Avoid eating in fast food restaurants.    HOW TO TAKE YOUR BLOOD PRESSURE: . Rest 5 minutes before taking your blood pressure. .  Don't smoke or drink caffeinated beverages for at least 30 minutes before. . Take your blood pressure before (not after) you eat. . Sit comfortably with your back supported and both feet on the floor (don't cross your legs). . Elevate your arm to heart level on a table or a desk. . Use the proper sized cuff. It should fit smoothly and snugly around your bare upper arm. There should be enough room to slip a fingertip under the cuff. The bottom edge of the cuff should be 1 inch above the crease of the elbow. . Ideally, take 3 measurements at one sitting and record the average.

## 2016-04-03 DIAGNOSIS — I252 Old myocardial infarction: Secondary | ICD-10-CM | POA: Diagnosis not present

## 2016-04-03 DIAGNOSIS — I10 Essential (primary) hypertension: Secondary | ICD-10-CM | POA: Diagnosis not present

## 2016-04-03 DIAGNOSIS — E785 Hyperlipidemia, unspecified: Secondary | ICD-10-CM | POA: Diagnosis not present

## 2016-04-03 DIAGNOSIS — Z0000 Encounter for general adult medical examination without abnormal findings: Secondary | ICD-10-CM | POA: Diagnosis not present

## 2016-04-03 DIAGNOSIS — K219 Gastro-esophageal reflux disease without esophagitis: Secondary | ICD-10-CM | POA: Diagnosis not present

## 2016-04-10 DIAGNOSIS — M71571 Other bursitis, not elsewhere classified, right ankle and foot: Secondary | ICD-10-CM | POA: Diagnosis not present

## 2016-04-10 DIAGNOSIS — M7751 Other enthesopathy of right foot: Secondary | ICD-10-CM | POA: Diagnosis not present

## 2016-04-10 DIAGNOSIS — M7752 Other enthesopathy of left foot: Secondary | ICD-10-CM | POA: Diagnosis not present

## 2016-04-10 DIAGNOSIS — M79674 Pain in right toe(s): Secondary | ICD-10-CM | POA: Diagnosis not present

## 2016-04-10 DIAGNOSIS — M2041 Other hammer toe(s) (acquired), right foot: Secondary | ICD-10-CM | POA: Diagnosis not present

## 2016-04-10 DIAGNOSIS — M2042 Other hammer toe(s) (acquired), left foot: Secondary | ICD-10-CM | POA: Diagnosis not present

## 2016-04-10 DIAGNOSIS — M79675 Pain in left toe(s): Secondary | ICD-10-CM | POA: Diagnosis not present

## 2016-05-21 ENCOUNTER — Ambulatory Visit: Payer: Medicare HMO

## 2016-05-27 DIAGNOSIS — H2512 Age-related nuclear cataract, left eye: Secondary | ICD-10-CM | POA: Diagnosis not present

## 2016-05-27 DIAGNOSIS — Z961 Presence of intraocular lens: Secondary | ICD-10-CM | POA: Diagnosis not present

## 2016-06-01 ENCOUNTER — Encounter: Payer: Self-pay | Admitting: Pharmacist Clinician (PhC)/ Clinical Pharmacy Specialist

## 2016-06-01 ENCOUNTER — Ambulatory Visit (INDEPENDENT_AMBULATORY_CARE_PROVIDER_SITE_OTHER): Payer: Medicare HMO | Admitting: Pharmacist Clinician (PhC)/ Clinical Pharmacy Specialist

## 2016-06-01 DIAGNOSIS — I10 Essential (primary) hypertension: Secondary | ICD-10-CM | POA: Diagnosis not present

## 2016-06-01 NOTE — Progress Notes (Signed)
     06/01/2016 RIES YAMANAKA [birth year] [id number]   HPI:  [name] is a 82 y.o. male patient of Dr Stanford Breed, with a PMH below who presents today for hypertension clinic follow up.   At his last visit we increased his ramipril from 5 mg to 10 mg daily.   Since that increase he has noticed a drop in his blood pressure.  He reports no dizziness or lightheadedness with positional change.   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  Blood Pressure Goal:  150/90   Current Medications: ramipril 10 mg qd  Cardiac Hx: CABG; most recent stress test showed EF 48%; hyperlipidemia,   Social Hx: no tobacco or alcohol; does drink caffeine (in sweet tea) daily  Diet: mostly eats out, fast food and sit down, admits to sausage egg biscuit this morning.  Does not add salt to his food.  Does not snack or graze between meals  Home BP readings:  Average 132/76 (from 43 readings over past 6 weeks); range 100-155/54-93; of 43 readings 4 had systolic > Q000111Q (highest 99991111) and 2 diastolic > 90 (highest 93)  Intolerances: none  Wt Readings from Last 3 Encounters:  06/01/16 209 lb (94.8 kg)  03/24/16 208 lb 6.4 oz (94.5 kg)  02/06/16 206 lb (93.4 kg)   BP Readings from Last 3 Encounters:  06/01/16 134/75  03/24/16 (!) 164/84  02/06/16 (!) 154/92   Pulse Readings from Last 3 Encounters:  06/01/16 72  03/24/16 72  02/06/16 73    Current Outpatient Prescriptions  Medication Sig Dispense Refill  . aspirin 81 MG tablet Take 81 mg by mouth daily.      Marland Kitchen atorvastatin (LIPITOR) 20 MG tablet Take 1 tablet (20 mg total) by mouth daily. 30 tablet 12  . Calcium 600-200 MG-UNIT per tablet Take 1 tablet by mouth daily.      Marland Kitchen esomeprazole (NEXIUM) 40 MG capsule Take 1 capsule (40 mg total) by mouth daily. 90 capsule 3  . multivitamin (THERAGRAN) per tablet Take 1 tablet by mouth daily.      . ramipril (ALTACE) 10 MG capsule Take 1 capsule (10 mg total) by mouth  daily. 90 capsule 3  . vitamin C (ASCORBIC ACID) 500 MG tablet Take 500 mg by mouth daily.       No current facility-administered medications for this visit.     No Known Allergies  Past Medical History:  Diagnosis Date  . Cerebrovascular disease   . Coronary artery disease   . GERD (gastroesophageal reflux disease)   . Hx of colonic polyps   . Hyperlipidemia   . Hypertension   . Premature ventricular contractions   . PSA elevation 02/20/10   PSA=1.7 with Dr. Rosana Hoes    Blood pressure 134/75, pulse 72, height 5\' 11"  (1.803 m), weight 209 lb (94.8 kg).    Tommy Medal PharmD CPP [facility]

## 2016-06-01 NOTE — Patient Instructions (Signed)
  Your blood pressure today is 134/72  (goal is < 150/90)  Check your blood pressure at home several times each week and keep record of the readings.  Take your BP meds as follows: continue with all current medications  Bring all of your meds, your BP cuff and your record of home blood pressures to your next appointment.  Exercise as you're able, try to walk approximately 30 minutes per day.  Keep salt intake to a minimum, especially watch canned and prepared boxed foods.  Eat more fresh fruits and vegetables and fewer canned items.  Avoid eating in fast food restaurants.    HOW TO TAKE YOUR BLOOD PRESSURE: . Rest 5 minutes before taking your blood pressure. .  Don't smoke or drink caffeinated beverages for at least 30 minutes before. . Take your blood pressure before (not after) you eat. . Sit comfortably with your back supported and both feet on the floor (don't cross your legs). . Elevate your arm to heart level on a table or a desk. . Use the proper sized cuff. It should fit smoothly and snugly around your bare upper arm. There should be enough room to slip a fingertip under the cuff. The bottom edge of the cuff should be 1 inch above the crease of the elbow. . Ideally, take 3 measurements at one sitting and record the average.

## 2016-06-01 NOTE — Assessment & Plan Note (Signed)
Blood pressure much improved since increasing ramipril to 10 mg.  Will have patient continue with this as well as continue with home BP monitoring several days per week.  He should notify us if he sees his pressure trending to > 150 again or if he develops any symptoms of hypotension.

## 2016-06-05 ENCOUNTER — Other Ambulatory Visit: Payer: Self-pay | Admitting: Cardiology

## 2016-06-09 DIAGNOSIS — R69 Illness, unspecified: Secondary | ICD-10-CM | POA: Diagnosis not present

## 2016-06-24 DIAGNOSIS — R69 Illness, unspecified: Secondary | ICD-10-CM | POA: Diagnosis not present

## 2016-07-01 ENCOUNTER — Encounter: Payer: Self-pay | Admitting: Internal Medicine

## 2016-07-01 ENCOUNTER — Ambulatory Visit (INDEPENDENT_AMBULATORY_CARE_PROVIDER_SITE_OTHER): Payer: Medicare HMO | Admitting: Internal Medicine

## 2016-07-01 VITALS — BP 120/84 | HR 69 | Temp 98.4°F | Resp 20 | Wt 209.0 lb

## 2016-07-01 DIAGNOSIS — H9319 Tinnitus, unspecified ear: Secondary | ICD-10-CM | POA: Diagnosis not present

## 2016-07-01 DIAGNOSIS — H6982 Other specified disorders of Eustachian tube, left ear: Secondary | ICD-10-CM | POA: Diagnosis not present

## 2016-07-01 DIAGNOSIS — R42 Dizziness and giddiness: Secondary | ICD-10-CM | POA: Diagnosis not present

## 2016-07-01 DIAGNOSIS — J309 Allergic rhinitis, unspecified: Secondary | ICD-10-CM | POA: Diagnosis not present

## 2016-07-01 MED ORDER — TRIAMCINOLONE ACETONIDE 55 MCG/ACT NA AERO: 2.0000 | INHALATION_SPRAY | Freq: Every day | NASAL | 12 refills | Status: DC

## 2016-07-01 MED ORDER — CETIRIZINE HCL 10 MG PO TABS: 10.0000 mg | ORAL_TABLET | Freq: Every day | ORAL | 11 refills | Status: DC

## 2016-07-01 NOTE — Progress Notes (Signed)
Subjective:    Patient ID: [name], male    DOB: 1932-03-17, 83 y.o.   MRN: [id number]  HPI  Here to f/u with few episodes 1-2 days onset vertigo over the last many year, more recently in the past wk has AM symptoms omly of head fullness and veritgo with first getting up that goes away in 1 hr, until today with some incresaed HA and dizziness during the day as well. Dizzy only occurs with position change. Has bilat ear tinnitus chronic for yrs, has seen audiology.  No fever or overt sinus symptoms but left ear feels full often. He denies any worsening SOB, fevers or feeling acutely ill. Altace increased, BP at home have been 130 and 140's for the most part.   Past Medical History:  Diagnosis Date  . Cerebrovascular disease   . Coronary artery disease   . GERD (gastroesophageal reflux disease)   . Hx of colonic polyps   . Hyperlipidemia   . Hypertension   . Premature ventricular contractions   . PSA elevation 02/20/10   PSA=1.7 with Dr. Rosana Hoes   Past Surgical History:  Procedure Laterality Date  . APPENDECTOMY    . CHOLECYSTECTOMY  Nov 2011  . CORONARY ARTERY BYPASS GRAFT  August 1996   LIMA to the LAD and diagonal, saphenous vein graft to the intermediate, obtuse marginale and circumflex, and a SVG to the RCA and PDA  . TONSILLECTOMY      reports that he has never smoked. He has never used smokeless tobacco. He reports that he does not drink alcohol or use drugs. family history includes Cancer in his other. No Known Allergies Current Outpatient Prescriptions on File Prior to Visit  Medication Sig Dispense Refill  . aspirin 81 MG tablet Take 81 mg by mouth daily.      Marland Kitchen atorvastatin (LIPITOR) 20 MG tablet TAKE ONE TABLET BY MOUTH ONCE DAILY 30 tablet 3  . Calcium 600-200 MG-UNIT per tablet Take 1 tablet by mouth daily.      Marland Kitchen esomeprazole (NEXIUM) 40 MG capsule Take 1 capsule (40 mg total) by mouth daily. 90 capsule 3  . multivitamin (THERAGRAN) per tablet Take 1 tablet by  mouth daily.      . ramipril (ALTACE) 10 MG capsule Take 1 capsule (10 mg total) by mouth daily. 90 capsule 3  . vitamin C (ASCORBIC ACID) 500 MG tablet Take 500 mg by mouth daily.       No current facility-administered medications on file prior to visit.    Review of Systems  Constitutional: Negative for unusual diaphoresis or night sweats HENT: Negative for ear swelling or discharge Eyes: Negative for worsening visual haziness  Respiratory: Negative for choking and stridor.   Gastrointestinal: Negative for distension or worsening eructation Genitourinary: Negative for retention or change in urine volume.  Musculoskeletal: Negative for other MSK pain or swelling Skin: Negative for color change and worsening wound Neurological: Negative for tremors and numbness other than noted  Psychiatric/Behavioral: Negative for decreased concentration or agitation other than above       Objective:   Physical Exam BP 120/84   Pulse 69   Temp 98.4 F (36.9 C) (Oral)   Resp 20   Wt 209 lb (94.8 kg)   SpO2 96%   BMI 29.15 kg/m  VS noted,  Constitutional: Pt appears in no apparent distress HENT: Head: NCAT.  Right Ear: External ear normal.  Left Ear: External ear normal.  Eyes: . Pupils are equal,  round, and reactive to light. Conjunctivae and EOM are normal Bilat tm's with mild erythema.  Max sinus areas non tender.  Pharynx with mild erythema, no exudate Neck: Normal range of motion. Neck supple.  Cardiovascular: Normal rate and regular rhythm.   Pulmonary/Chest: Effort normal and breath sounds without rales or wheezing.  Abd:  Soft, NT, ND, + BS Neurological: Pt is alert. Not confused , motor 5/5 intact, ftn intact, cn 2-12 intact Skin: Skin is warm. No rash, no LE edema Psychiatric: Pt behavior is normal. No agitation.     Assessment & Plan:

## 2016-07-01 NOTE — Patient Instructions (Signed)
Please take all new medication as prescribed- the otc zyrtec and nasacort for the sinuses, which can then also help the ears  You can also take Mucinex (or it's generic off brand) for congestion, and tylenol as needed for pain.  Please take all new medication as prescribed - the meclizine as needed for dizziness  Please continue all other medications as before, and refills have been done if requested.  Please have the pharmacy call with any other refills you may need.  Please keep your appointments with your specialists as you may have planned

## 2016-07-01 NOTE — Progress Notes (Signed)
Pre visit review using our clinic review tool, if applicable. No additional management support is needed unless otherwise documented below in the visit note. 

## 2016-07-04 DIAGNOSIS — H6980 Other specified disorders of Eustachian tube, unspecified ear: Secondary | ICD-10-CM | POA: Insufficient documentation

## 2016-07-04 DIAGNOSIS — H9319 Tinnitus, unspecified ear: Secondary | ICD-10-CM | POA: Insufficient documentation

## 2016-07-04 DIAGNOSIS — R42 Dizziness and giddiness: Secondary | ICD-10-CM | POA: Insufficient documentation

## 2016-07-04 DIAGNOSIS — J309 Allergic rhinitis, unspecified: Secondary | ICD-10-CM | POA: Insufficient documentation

## 2016-07-04 NOTE — Assessment & Plan Note (Signed)
/  Mild, for mucinex otc prn,  to f/u any worsening symptoms or concerns 

## 2016-07-04 NOTE — Assessment & Plan Note (Signed)
Mild to mod, for zyrtec and nasacort asd,  to f/u any worsening symptoms or concerns 

## 2016-07-04 NOTE — Assessment & Plan Note (Signed)
Unfortunately no specific tx available, cont to monitor, consider ent

## 2016-07-04 NOTE — Assessment & Plan Note (Signed)
Mild to mod, for meclizine prn, to f/u any worsening symptoms or concerns 

## 2016-07-17 DIAGNOSIS — R3912 Poor urinary stream: Secondary | ICD-10-CM | POA: Diagnosis not present

## 2016-07-17 DIAGNOSIS — N401 Enlarged prostate with lower urinary tract symptoms: Secondary | ICD-10-CM | POA: Diagnosis not present

## 2016-07-17 DIAGNOSIS — Z8042 Family history of malignant neoplasm of prostate: Secondary | ICD-10-CM | POA: Diagnosis not present

## 2016-07-20 ENCOUNTER — Other Ambulatory Visit: Payer: Self-pay | Admitting: Cardiology

## 2016-12-09 DIAGNOSIS — Z6830 Body mass index (BMI) 30.0-30.9, adult: Secondary | ICD-10-CM | POA: Diagnosis not present

## 2016-12-09 DIAGNOSIS — H9113 Presbycusis, bilateral: Secondary | ICD-10-CM | POA: Diagnosis not present

## 2016-12-09 DIAGNOSIS — Z973 Presence of spectacles and contact lenses: Secondary | ICD-10-CM | POA: Diagnosis not present

## 2016-12-09 DIAGNOSIS — K219 Gastro-esophageal reflux disease without esophagitis: Secondary | ICD-10-CM | POA: Diagnosis not present

## 2016-12-09 DIAGNOSIS — E785 Hyperlipidemia, unspecified: Secondary | ICD-10-CM | POA: Diagnosis not present

## 2016-12-09 DIAGNOSIS — H259 Unspecified age-related cataract: Secondary | ICD-10-CM | POA: Diagnosis not present

## 2016-12-09 DIAGNOSIS — I10 Essential (primary) hypertension: Secondary | ICD-10-CM | POA: Diagnosis not present

## 2016-12-09 DIAGNOSIS — I252 Old myocardial infarction: Secondary | ICD-10-CM | POA: Diagnosis not present

## 2016-12-09 DIAGNOSIS — Z7982 Long term (current) use of aspirin: Secondary | ICD-10-CM | POA: Diagnosis not present

## 2016-12-09 DIAGNOSIS — Z0000 Encounter for general adult medical examination without abnormal findings: Secondary | ICD-10-CM | POA: Diagnosis not present

## 2017-02-24 DIAGNOSIS — Z961 Presence of intraocular lens: Secondary | ICD-10-CM | POA: Diagnosis not present

## 2017-02-24 DIAGNOSIS — H2512 Age-related nuclear cataract, left eye: Secondary | ICD-10-CM | POA: Diagnosis not present

## 2017-03-10 DIAGNOSIS — R69 Illness, unspecified: Secondary | ICD-10-CM | POA: Diagnosis not present

## 2017-04-02 NOTE — Progress Notes (Signed)
HPI: FU coronary artery disease status post coronary artery bypassing graft. Abd ultrasound 9/14 showed no aneurysm. Nuclear study April 2017 showed ejection fraction 48%, inferior infarct but no ischemia. Carotid Dopplers April 2017 showed 1-39% bilateral stenosis. Since I last saw him, the patient has dyspnea with more extreme activities but not with routine activities. It is relieved with rest. It is not associated with chest pain. There is no orthopnea, PND or pedal edema. There is no syncope or palpitations. There is no exertional chest pain.   Current Outpatient Prescriptions  Medication Sig Dispense Refill  . aspirin 81 MG tablet Take 81 mg by mouth daily.      Marland Kitchen atorvastatin (LIPITOR) 20 MG tablet TAKE ONE TABLET BY MOUTH ONCE DAILY 30 tablet 3  . Calcium 600-200 MG-UNIT per tablet Take 1 tablet by mouth daily.      Marland Kitchen esomeprazole (NEXIUM) 40 MG capsule Take 1 capsule (40 mg total) by mouth daily. 90 capsule 3  . multivitamin (THERAGRAN) per tablet Take 1 tablet by mouth daily.      . ramipril (ALTACE) 10 MG capsule Take 1 capsule (10 mg total) by mouth daily. 90 capsule 3  . triamcinolone (NASACORT AQ) 55 MCG/ACT AERO nasal inhaler Place 2 sprays into the nose daily. 1 Inhaler 12  . vitamin C (ASCORBIC ACID) 500 MG tablet Take 500 mg by mouth daily.       No current facility-administered medications for this visit.      Past Medical History:  Diagnosis Date  . Cerebrovascular disease   . Coronary artery disease   . GERD (gastroesophageal reflux disease)   . Hx of colonic polyps   . Hyperlipidemia   . Hypertension   . Premature ventricular contractions   . PSA elevation 02/20/10   PSA=1.7 with Dr. Rosana Hoes    Past Surgical History:  Procedure Laterality Date  . APPENDECTOMY    . CHOLECYSTECTOMY  Nov 2011  . CORONARY ARTERY BYPASS GRAFT  August 1996   LIMA to the LAD and diagonal, saphenous vein graft to the intermediate, obtuse marginale and circumflex, and a SVG to  the RCA and PDA  . TONSILLECTOMY      Social History   Social History  . Marital status: Widowed    Spouse name: N/A  . Number of children: N/A  . Years of education: N/A   Occupational History  . Retired Retired   Social History Main Topics  . Smoking status: Never Smoker  . Smokeless tobacco: Never Used  . Alcohol use No  . Drug use: No  . Sexual activity: Not Currently   Other Topics Concern  . Not on file   Social History Narrative   Gets regular exercise.    Family History  Problem Relation Age of Onset  . Prostate cancer Unknown        1st degree relative <50  . Cancer Other        1st degree relative    ROS: no fevers or chills, productive cough, hemoptysis, dysphasia, odynophagia, melena, hematochezia, dysuria, hematuria, rash, seizure activity, orthopnea, PND, pedal edema, claudication. Remaining systems are negative.  Physical Exam: Well-developed well-nourished in no acute distress.  Skin is warm and dry.  HEENT is normal.  Neck is supple.  Chest is clear to auscultation with normal expansion.  Cardiovascular exam is regular rate and rhythm.  Abdominal exam nontender or distended. No masses palpated. Extremities show no edema. neuro grossly intact  ECG-Sinus rhythm  at a rate of 71, left anterior fascicular block, right bundle branch block. personally reviewed  A/P  1 Coronary artery disease-continue aspirin and statin.  2 carotid artery disease- mild on most recent Dopplers. Continue aspirin and statin.   3 hypertension-blood pressure borderline. Continue present medications and follow. Check K and renal function.   4 hyperlipidemia-continue statin. Check lipids and liver.  Kirk Ruths, MD

## 2017-04-08 ENCOUNTER — Telehealth: Payer: Self-pay | Admitting: *Deleted

## 2017-04-08 ENCOUNTER — Encounter: Payer: Self-pay | Admitting: Cardiology

## 2017-04-08 ENCOUNTER — Ambulatory Visit (INDEPENDENT_AMBULATORY_CARE_PROVIDER_SITE_OTHER): Payer: Medicare HMO | Admitting: Cardiology

## 2017-04-08 VITALS — BP 148/86 | HR 71 | Ht 71.0 in | Wt 208.2 lb

## 2017-04-08 DIAGNOSIS — I25810 Atherosclerosis of coronary artery bypass graft(s) without angina pectoris: Secondary | ICD-10-CM | POA: Diagnosis not present

## 2017-04-08 DIAGNOSIS — R748 Abnormal levels of other serum enzymes: Secondary | ICD-10-CM

## 2017-04-08 DIAGNOSIS — E7800 Pure hypercholesterolemia, unspecified: Secondary | ICD-10-CM | POA: Diagnosis not present

## 2017-04-08 DIAGNOSIS — I10 Essential (primary) hypertension: Secondary | ICD-10-CM

## 2017-04-08 DIAGNOSIS — I6523 Occlusion and stenosis of bilateral carotid arteries: Secondary | ICD-10-CM | POA: Diagnosis not present

## 2017-04-08 LAB — BASIC METABOLIC PANEL
BUN/Creatinine Ratio: 13 (ref 10–24)
BUN: 18 mg/dL (ref 8–27)
CALCIUM: 9.5 mg/dL (ref 8.6–10.2)
CHLORIDE: 104 mmol/L (ref 96–106)
CO2: 22 mmol/L (ref 20–29)
Creatinine, Ser: 1.35 mg/dL — ABNORMAL HIGH (ref 0.76–1.27)
GFR calc non Af Amer: 49 mL/min/{1.73_m2} — ABNORMAL LOW (ref >59)
GFR, EST AFRICAN AMERICAN: 56 mL/min/{1.73_m2} — AB (ref >59)
Glucose: 101 mg/dL — ABNORMAL HIGH (ref 65–99)
Potassium: 4.9 mmol/L (ref 3.5–5.2)
Sodium: 141 mmol/L (ref 134–144)

## 2017-04-08 LAB — HEPATIC FUNCTION PANEL
ALBUMIN: 4 g/dL (ref 3.5–4.7)
ALT: 18 IU/L (ref 0–44)
AST: 22 IU/L (ref 0–40)
Alkaline Phosphatase: 85 IU/L (ref 39–117)
BILIRUBIN TOTAL: 0.8 mg/dL (ref 0.0–1.2)
Bilirubin, Direct: 0.21 mg/dL (ref 0.00–0.40)
TOTAL PROTEIN: 7 g/dL (ref 6.0–8.5)

## 2017-04-08 LAB — LIPID PANEL
CHOL/HDL RATIO: 3.2 ratio (ref 0.0–5.0)
Cholesterol, Total: 137 mg/dL (ref 100–199)
HDL: 43 mg/dL (ref >39)
LDL CALC: 68 mg/dL (ref 0–99)
Triglycerides: 131 mg/dL (ref 0–149)
VLDL Cholesterol Cal: 26 mg/dL (ref 5–40)

## 2017-04-08 NOTE — Patient Instructions (Signed)
Medication Instructions:   NO CHANGE  Labwork:  Your physician recommends that you HAVE LAB WORK TODAY  Follow-Up:  Your physician recommends that you schedule a follow-up appointment AS NEEDED

## 2017-04-08 NOTE — Telephone Encounter (Addendum)
-----   Message from Lelon Perla, MD sent at 04/08/2017  5:03 PM EDT ----- Cr slightly increased compared to previous; increase po fluid intake and repeat bmet 2 weeks [place] with pt, aware to increase fluids. Lab orders mailed to the pt

## 2017-04-17 ENCOUNTER — Other Ambulatory Visit: Payer: Self-pay | Admitting: Cardiology

## 2017-04-22 DIAGNOSIS — R748 Abnormal levels of other serum enzymes: Secondary | ICD-10-CM | POA: Diagnosis not present

## 2017-04-22 LAB — BASIC METABOLIC PANEL
BUN/Creatinine Ratio: 19 (ref 10–24)
BUN: 21 mg/dL (ref 8–27)
CALCIUM: 9.4 mg/dL (ref 8.6–10.2)
CHLORIDE: 106 mmol/L (ref 96–106)
CO2: 25 mmol/L (ref 20–29)
Creatinine, Ser: 1.12 mg/dL (ref 0.76–1.27)
GFR, EST AFRICAN AMERICAN: 70 mL/min/{1.73_m2} (ref >59)
GFR, EST NON AFRICAN AMERICAN: 60 mL/min/{1.73_m2} (ref >59)
Glucose: 91 mg/dL (ref 65–99)
Potassium: 5.1 mmol/L (ref 3.5–5.2)
Sodium: 145 mmol/L — ABNORMAL HIGH (ref 134–144)

## 2017-06-08 DIAGNOSIS — R69 Illness, unspecified: Secondary | ICD-10-CM | POA: Diagnosis not present

## 2017-06-08 DIAGNOSIS — H52203 Unspecified astigmatism, bilateral: Secondary | ICD-10-CM | POA: Diagnosis not present

## 2017-06-08 DIAGNOSIS — H25812 Combined forms of age-related cataract, left eye: Secondary | ICD-10-CM | POA: Diagnosis not present

## 2017-06-21 DIAGNOSIS — H25812 Combined forms of age-related cataract, left eye: Secondary | ICD-10-CM | POA: Diagnosis not present

## 2017-07-07 DIAGNOSIS — H25812 Combined forms of age-related cataract, left eye: Secondary | ICD-10-CM | POA: Diagnosis not present

## 2017-07-13 DIAGNOSIS — R69 Illness, unspecified: Secondary | ICD-10-CM | POA: Diagnosis not present

## 2017-07-14 DIAGNOSIS — H25812 Combined forms of age-related cataract, left eye: Secondary | ICD-10-CM | POA: Diagnosis not present

## 2017-07-14 DIAGNOSIS — I252 Old myocardial infarction: Secondary | ICD-10-CM | POA: Diagnosis not present

## 2017-07-14 DIAGNOSIS — H269 Unspecified cataract: Secondary | ICD-10-CM | POA: Diagnosis not present

## 2017-07-14 DIAGNOSIS — I10 Essential (primary) hypertension: Secondary | ICD-10-CM | POA: Diagnosis not present

## 2017-07-25 ENCOUNTER — Other Ambulatory Visit: Payer: Self-pay | Admitting: Cardiology

## 2017-07-26 NOTE — Telephone Encounter (Signed)
REFILL 

## 2017-07-27 ENCOUNTER — Other Ambulatory Visit: Payer: Self-pay | Admitting: Cardiology

## 2017-07-27 MED ORDER — ATORVASTATIN CALCIUM 20 MG PO TABS: 20.0000 mg | ORAL_TABLET | Freq: Every day | ORAL | 0 refills | Status: DC

## 2017-07-29 DIAGNOSIS — Z0101 Encounter for examination of eyes and vision with abnormal findings: Secondary | ICD-10-CM | POA: Diagnosis not present

## 2017-08-03 ENCOUNTER — Other Ambulatory Visit: Payer: Self-pay | Admitting: Cardiology

## 2017-08-03 MED ORDER — ATORVASTATIN CALCIUM 20 MG PO TABS: 20.0000 mg | ORAL_TABLET | Freq: Every day | ORAL | 0 refills | Status: AC

## 2017-08-03 NOTE — Telephone Encounter (Signed)
°*  STAT* If patient is at the pharmacy, call can be transferred to refill team.   1. Which medications need to be refilled? (please list name of each medication and dose if known)Atorvastatin-need new prescription  2. Which pharmacy/location (including street and city if local pharmacy) is medication to be sent to?[facility]in [phone]  3. Do they need a 30 day or 90 day supply? 90 and refills

## 2018-03-08 ENCOUNTER — Ambulatory Visit: Payer: Medicare HMO | Admitting: Emergency Medicine

## 2018-03-08 ENCOUNTER — Other Ambulatory Visit: Payer: Self-pay

## 2018-03-08 ENCOUNTER — Encounter: Payer: Self-pay | Admitting: Emergency Medicine

## 2018-03-08 VITALS — BP 128/66 | HR 69 | Temp 98.1°F | Resp 16 | Ht 70.0 in | Wt 186.8 lb

## 2018-03-08 DIAGNOSIS — J029 Acute pharyngitis, unspecified: Secondary | ICD-10-CM | POA: Insufficient documentation

## 2018-03-08 MED ORDER — AMOXICILLIN-POT CLAVULANATE 875-125 MG PO TABS: 1.0000 | ORAL_TABLET | Freq: Two times a day (BID) | ORAL | 0 refills | Status: AC

## 2018-03-08 NOTE — Progress Notes (Signed)
[name] 84 y.o.   Chief Complaint  Patient presents with  . Sore Throat    started last night    HISTORY OF PRESENT ILLNESS: This is a 84 y.o. male complaining of sore throat since last night.  No other significant symptoms.  Denies fever or chills.  Able to swallow both liquids and solids.  Leaving for the [place] tomorrow morning.  Sore Throat   This is a new problem. The current episode started yesterday. The problem has been unchanged. There has been no fever. The pain is mild. Pertinent negatives include no abdominal pain, congestion, coughing, diarrhea, ear pain, headaches, neck pain, shortness of breath, stridor, swollen glands, trouble swallowing or vomiting. He has had no exposure to strep or mono. He has tried nothing for the symptoms.     Prior to Admission medications   Medication Sig Start Date End Date Taking? Authorizing Provider  aspirin 81 MG tablet Take 81 mg by mouth daily.     Yes [provider]  atorvastatin (LIPITOR) 20 MG tablet Take 1 tablet (20 mg total) by mouth daily. 08/03/17  Yes Lelon Perla, MD  Calcium 600-200 MG-UNIT per tablet Take 1 tablet by mouth daily.     Yes [provider]  multivitamin [facility]) per tablet Take 1 tablet by mouth daily.     Yes [provider]  ramipril (ALTACE) 10 MG capsule TAKE ONE CAPSULE BY MOUTH ONCE DAILY 04/19/17  Yes Lelon Perla, MD  esomeprazole (NEXIUM) 40 MG capsule Take 1 capsule (40 mg total) by mouth daily. Patient not taking: Reported on 03/08/2018 01/16/14   Janith Lima, MD  triamcinolone (NASACORT AQ) 55 MCG/ACT AERO nasal inhaler Place 2 sprays into the nose daily. Patient not taking: Reported on 03/08/2018 07/01/16   Biagio Borg, MD  vitamin C (ASCORBIC ACID) 500 MG tablet Take 500 mg by mouth daily.      [provider]    No Known Allergies  Patient Active Problem List   Diagnosis Date Noted  . Eustachian tube dysfunction 07/04/2016  .  Tinnitus 07/04/2016  . Vertigo 07/04/2016  . Allergic rhinitis 07/04/2016  . Facial abrasion 12/08/2013  . Dyspnea 05/19/2012  . Abdominal aneurysm without mention of rupture 07/07/2010  . Essential hypertension 06/27/2010  . GERD 06/27/2010  . HYPERLIPIDEMIA 05/23/2009  . Coronary atherosclerosis 05/23/2009  . PREMATURE VENTRICULAR CONTRACTIONS 05/23/2009  . CEREBROVASCULAR DISEASE 05/23/2009    Past Medical History:  Diagnosis Date  . Cerebrovascular disease   . Coronary artery disease   . GERD (gastroesophageal reflux disease)   . Hx of colonic polyps   . Hyperlipidemia   . Hypertension   . Premature ventricular contractions   . PSA elevation 02/20/10   PSA=1.7 with Dr. Rosana Hoes    Past Surgical History:  Procedure Laterality Date  . APPENDECTOMY    . CHOLECYSTECTOMY  Nov 2011  . CORONARY ARTERY BYPASS GRAFT  August 1996   LIMA to the LAD and diagonal, saphenous vein graft to the intermediate, obtuse marginale and circumflex, and a SVG to the RCA and PDA  . TONSILLECTOMY      Social History   Socioeconomic History  . Marital status: Widowed    Spouse name: Not on file  . Number of children: Not on file  . Years of education: Not on file  . Highest education level: Not on file  Occupational History  . Occupation: Retired    Fish farm manager: RETIRED  Social Needs  .  Financial resource strain: Not on file  . Food insecurity:    Worry: Not on file    Inability: Not on file  . Transportation needs:    Medical: Not on file    Non-medical: Not on file  Tobacco Use  . Smoking status: Never Smoker  . Smokeless tobacco: Never Used  Substance and Sexual Activity  . Alcohol use: No  . Drug use: No  . Sexual activity: Not Currently  Lifestyle  . Physical activity:    Days per week: Not on file    Minutes per session: Not on file  . Stress: Not on file  Relationships  . Social connections:    Talks on phone: Not on file    Gets together: Not on file    Attends  religious service: Not on file    Active member of club or organization: Not on file    Attends meetings of clubs or organizations: Not on file    Relationship status: Not on file  . Intimate partner violence:    Fear of current or ex partner: Not on file    Emotionally abused: Not on file    Physically abused: Not on file    Forced sexual activity: Not on file  Other Topics Concern  . Not on file  Social History Narrative   Gets regular exercise.    Family History  Problem Relation Age of Onset  . Prostate cancer Unknown        1st degree relative <50  . Cancer Other        1st degree relative     Review of Systems  Constitutional: Negative.  Negative for chills and fever.  HENT: Positive for sore throat. Negative for congestion, ear pain and trouble swallowing.   Eyes: Negative.  Negative for discharge and redness.  Respiratory: Negative for cough, shortness of breath and stridor.   Cardiovascular: Negative.  Negative for chest pain and palpitations.  Gastrointestinal: Negative for abdominal pain, diarrhea, nausea and vomiting.  Musculoskeletal: Negative for neck pain.  Skin: Negative for rash.  Neurological: Negative for sensory change and headaches.  Endo/Heme/Allergies: Negative.   All other systems reviewed and are negative.   Vitals:   03/08/18 0940  BP: 128/66  Pulse: 69  Resp: 16  Temp: 98.1 F (36.7 C)    Physical Exam  Constitutional: He is oriented to person, place, and time. He appears well-developed and well-nourished.  HENT:  Head: Normocephalic.  Mouth/Throat: Uvula is midline. No uvula swelling. Posterior oropharyngeal erythema present. No oropharyngeal exudate, posterior oropharyngeal edema or tonsillar abscesses.  Eyes: Pupils are equal, round, and reactive to light. Conjunctivae and EOM are normal.  Neck: Normal range of motion. Neck supple.  Cardiovascular: Normal rate, regular rhythm and normal heart sounds.  Pulmonary/Chest: Effort normal  and breath sounds normal.  Musculoskeletal: Normal range of motion.  Lymphadenopathy:    He has no cervical adenopathy.  Neurological: He is alert and oriented to person, place, and time. No sensory deficit. He exhibits normal muscle tone.  Skin: Skin is warm and dry. Capillary refill takes less than 2 seconds.  Psychiatric: He has a normal mood and affect. His behavior is normal.  Vitals reviewed.    ASSESSMENT & PLAN: Hendricks was seen today for sore throat.  Diagnoses and all orders for this visit:  Sore throat -     POCT rapid strep A -     amoxicillin-clavulanate (AUGMENTIN) 875-125 MG tablet; Take 1 tablet by mouth  2 (two) times daily for 7 days.  Acute pharyngitis, unspecified etiology -     amoxicillin-clavulanate (AUGMENTIN) 875-125 MG tablet; Take 1 tablet by mouth 2 (two) times daily for 7 days.    Patient Instructions       IF you received an x-ray today, you will receive an invoice from [facility]. Please contact [facility] at [phone] with questions or concerns regarding your invoice.   IF you received labwork today, you will receive an invoice from [facility]. Please contact [facility] at [phone] with questions or concerns regarding your invoice.   Our billing staff will not be able to assist you with questions regarding bills from these companies.  You will be contacted with the lab results as soon as they are available. The fastest way to get your results is to activate your My Chart account. Instructions are located on the last page of this paperwork. If you have not heard from [place] regarding the results in 2 weeks, please contact this office.     Sore Throat When you have a sore throat, your throat may:  Hurt.  Burn.  Feel irritated.  Feel scratchy.  Many things can cause a sore throat, including:  An infection.  Allergies.  Dryness in the air.  Smoke or pollution.  Gastroesophageal reflux disease (GERD).  A  tumor.  A sore throat can be the first sign of another sickness. It can happen with other problems, like coughing or a fever. Most sore throats go away without treatment. Follow these instructions at home:  Take over-the-counter medicines only as told by your doctor.  Drink enough fluids to keep your pee (urine) clear or pale yellow.  Rest when you feel you need to.  To help with pain, try: ? Sipping warm liquids, such as broth, herbal tea, or warm water. ? Eating or drinking cold or frozen liquids, such as frozen ice pops. ? Gargling with a salt-water mixture 3-4 times a day or as needed. To make a salt-water mixture, add -1 tsp of salt in 1 cup of warm water. Mix it until you cannot see the salt anymore. ? Sucking on hard candy or throat lozenges. ? Putting a cool-mist humidifier in your bedroom at night. ? Sitting in the bathroom with the door closed for 5-10 minutes while you run hot water in the shower.  Do not use any tobacco products, such as cigarettes, chewing tobacco, and e-cigarettes. If you need help quitting, ask your doctor. Contact a doctor if:  You have a fever for more than 2-3 days.  You keep having symptoms for more than 2-3 days.  Your throat does not get better in 7 days.  You have a fever and your symptoms suddenly get worse. Get help right away if:  You have trouble breathing.  You cannot swallow fluids, soft foods, or your saliva.  You have swelling in your throat or neck that gets worse.  You keep feeling like you are going to throw up (vomit).  You keep throwing up. This information is not intended to replace advice given to you by your health care provider. Make sure you discuss any questions you have with your health care provider. Document Released: 07/07/2008 Document Revised: 05/24/2016 Document Reviewed: 07/19/2015 Elsevier Interactive Patient Education  2018 Elsevier Inc.      Agustina Caroli, MD [facility]

## 2018-03-08 NOTE — Patient Instructions (Addendum)
     IF you received an x-ray today, you will receive an invoice from [facility]. Please contact  Radiology at [phone] with questions or concerns regarding your invoice.   IF you received labwork today, you will receive an invoice from [facility]. Please contact [facility] at [phone] with questions or concerns regarding your invoice.   Our billing staff will not be able to assist you with questions regarding bills from these companies.  You will be contacted with the lab results as soon as they are available. The fastest way to get your results is to activate your My Chart account. Instructions are located on the last page of this paperwork. If you have not heard from us regarding the results in 2 weeks, please contact this office.     Sore Throat When you have a sore throat, your throat may:  Hurt.  Burn.  Feel irritated.  Feel scratchy.  Many things can cause a sore throat, including:  An infection.  Allergies.  Dryness in the air.  Smoke or pollution.  Gastroesophageal reflux disease (GERD).  A tumor.  A sore throat can be the first sign of another sickness. It can happen with other problems, like coughing or a fever. Most sore throats go away without treatment. Follow these instructions at home:  Take over-the-counter medicines only as told by your doctor.  Drink enough fluids to keep your pee (urine) clear or pale yellow.  Rest when you feel you need to.  To help with pain, try: ? Sipping warm liquids, such as broth, herbal tea, or warm water. ? Eating or drinking cold or frozen liquids, such as frozen ice pops. ? Gargling with a salt-water mixture 3-4 times a day or as needed. To make a salt-water mixture, add -1 tsp of salt in 1 cup of warm water. Mix it until you cannot see the salt anymore. ? Sucking on hard candy or throat lozenges. ? Putting a cool-mist humidifier in your bedroom at night. ? Sitting in the bathroom with the  door closed for 5-10 minutes while you run hot water in the shower.  Do not use any tobacco products, such as cigarettes, chewing tobacco, and e-cigarettes. If you need help quitting, ask your doctor. Contact a doctor if:  You have a fever for more than 2-3 days.  You keep having symptoms for more than 2-3 days.  Your throat does not get better in 7 days.  You have a fever and your symptoms suddenly get worse. Get help right away if:  You have trouble breathing.  You cannot swallow fluids, soft foods, or your saliva.  You have swelling in your throat or neck that gets worse.  You keep feeling like you are going to throw up (vomit).  You keep throwing up. This information is not intended to replace advice given to you by your health care provider. Make sure you discuss any questions you have with your health care provider. Document Released: 07/07/2008 Document Revised: 05/24/2016 Document Reviewed: 07/19/2015 Elsevier Interactive Patient Education  2018 Elsevier Inc.  

## 2018-04-17 ENCOUNTER — Other Ambulatory Visit: Payer: Self-pay | Admitting: Cardiology

## 2018-04-18 NOTE — Telephone Encounter (Signed)
Rx sent to pharmacy   

## 2018-12-19 ENCOUNTER — Ambulatory Visit: Payer: Medicare HMO | Admitting: Emergency Medicine

## 2018-12-19 ENCOUNTER — Other Ambulatory Visit: Payer: Self-pay

## 2018-12-19 ENCOUNTER — Encounter: Payer: Self-pay | Admitting: Emergency Medicine

## 2018-12-19 VITALS — BP 119/72 | HR 74 | Temp 98.6°F | Resp 18 | Ht 70.0 in | Wt 195.8 lb

## 2018-12-19 DIAGNOSIS — J988 Other specified respiratory disorders: Secondary | ICD-10-CM | POA: Diagnosis not present

## 2018-12-19 DIAGNOSIS — R05 Cough: Secondary | ICD-10-CM

## 2018-12-19 DIAGNOSIS — B9789 Other viral agents as the cause of diseases classified elsewhere: Secondary | ICD-10-CM | POA: Diagnosis not present

## 2018-12-19 DIAGNOSIS — R059 Cough, unspecified: Secondary | ICD-10-CM

## 2018-12-19 NOTE — Progress Notes (Signed)
[name] 85 y.o.   Chief Complaint  Patient presents with  . Cough    congestion in chest started about friday     HISTORY OF PRESENT ILLNESS: This is a 86 y.o. male complaining of dry cough with chest congestion that started with sore throat 3 days ago.  [name] recently flew here to be with his son.  No other significant symptoms.  Denies fever or chills.  Denies GI symptoms.  No history of diabetes or COPD.  No history of autoimmune condition.  No other complaints or medical concerns at this time.  HPI   Prior to Admission medications   Medication Sig Start Date End Date Taking? Authorizing Provider  aspirin 81 MG tablet Take 81 mg by mouth daily.     Yes [provider]  atorvastatin (LIPITOR) 20 MG tablet Take 1 tablet (20 mg total) by mouth daily. 08/03/17  Yes Lelon Perla, MD  Calcium 600-200 MG-UNIT per tablet Take 1 tablet by mouth daily.     Yes [provider]  multivitamin [facility]) per tablet Take 1 tablet by mouth daily.     Yes [provider]  ramipril (ALTACE) 10 MG capsule TAKE 1 CAPSULE BY MOUTH EVERY DAY 04/18/18  Yes Crenshaw, Denice Bors, MD  vitamin C (ASCORBIC ACID) 500 MG tablet Take 500 mg by mouth daily.     Yes [provider]    No Known Allergies  Patient Active Problem List   Diagnosis Date Noted  . Sore throat 03/08/2018  . Acute pharyngitis 03/08/2018  . Eustachian tube dysfunction 07/04/2016  . Tinnitus 07/04/2016  . Vertigo 07/04/2016  . Allergic rhinitis 07/04/2016  . Facial abrasion 12/08/2013  . Dyspnea 05/19/2012  . Abdominal aneurysm without mention of rupture 07/07/2010  . Essential hypertension 06/27/2010  . GERD 06/27/2010  . HYPERLIPIDEMIA 05/23/2009  . Coronary atherosclerosis 05/23/2009  . PREMATURE VENTRICULAR CONTRACTIONS 05/23/2009  . CEREBROVASCULAR DISEASE 05/23/2009    Past Medical History:  Diagnosis Date  . Cerebrovascular disease   . Coronary artery disease    . GERD (gastroesophageal reflux disease)   . Heart attack ([place])   . Hx of colonic polyps   . Hyperlipidemia   . Hypertension   . Premature ventricular contractions   . PSA elevation 02/20/10   PSA=1.7 with Dr. Rosana Hoes    Past Surgical History:  Procedure Laterality Date  . APPENDECTOMY    . CHOLECYSTECTOMY  Nov 2011  . CORONARY ARTERY BYPASS GRAFT  August 1996   LIMA to the LAD and diagonal, saphenous vein graft to the intermediate, obtuse marginale and circumflex, and a SVG to the RCA and PDA  . EYE SURGERY    . TONSILLECTOMY      Social History   Socioeconomic History  . Marital status: Widowed    Spouse name: Not on file  . Number of children: Not on file  . Years of education: Not on file  . Highest education level: Not on file  Occupational History  . Occupation: Retired    Fish farm manager: RETIRED  Social Needs  . Financial resource strain: Not on file  . Food insecurity:    Worry: Not on file    Inability: Not on file  . Transportation needs:    Medical: Not on file    Non-medical: Not on file  Tobacco Use  . Smoking status: Never Smoker  . Smokeless tobacco: Never Used  Substance and Sexual Activity  . Alcohol use: No  .  Drug use: No  . Sexual activity: Not Currently  Lifestyle  . Physical activity:    Days per week: Not on file    Minutes per session: Not on file  . Stress: Not on file  Relationships  . Social connections:    Talks on phone: Not on file    Gets together: Not on file    Attends religious service: Not on file    Active member of club or organization: Not on file    Attends meetings of clubs or organizations: Not on file    Relationship status: Not on file  . Intimate partner violence:    Fear of current or ex partner: Not on file    Emotionally abused: Not on file    Physically abused: Not on file    Forced sexual activity: Not on file  Other Topics Concern  . Not on file  Social History Narrative   Gets regular exercise.    Family  History  Problem Relation Age of Onset  . Prostate cancer Unknown        1st degree relative <50  . Cancer Other        1st degree relative     Review of Systems  Constitutional: Negative.  Negative for chills and fever.  HENT: Positive for sore throat.   Eyes: Negative.   Respiratory: Positive for cough. Negative for hemoptysis, sputum production, shortness of breath and wheezing.   Cardiovascular: Negative.  Negative for chest pain and palpitations.  Gastrointestinal: Negative for abdominal pain, nausea and vomiting.  Genitourinary: Negative.   Musculoskeletal: Negative.  Negative for back pain, myalgias and neck pain.  Skin: Negative for rash.  Neurological: Negative for dizziness and headaches.  Endo/Heme/Allergies: Negative.   All other systems reviewed and are negative.  Vitals:   12/19/18 1322  BP: 119/72  Pulse: 74  Resp: 18  Temp: 98.6 F (37 C)  SpO2: 95%     Physical Exam Vitals signs reviewed.  Constitutional:      Appearance: Normal appearance.  HENT:     Head: Normocephalic and atraumatic.     Nose: Nose normal.     Mouth/Throat:     Mouth: Mucous membranes are moist.     Pharynx: Oropharynx is clear.  Eyes:     Extraocular Movements: Extraocular movements intact.     Conjunctiva/sclera: Conjunctivae normal.     Pupils: Pupils are equal, round, and reactive to light.  Neck:     Musculoskeletal: Normal range of motion and neck supple.  Cardiovascular:     Rate and Rhythm: Normal rate and regular rhythm.     Heart sounds: Normal heart sounds.  Pulmonary:     Effort: Pulmonary effort is normal.     Breath sounds: Normal breath sounds.  Musculoskeletal: Normal range of motion.  Skin:    General: Skin is warm and dry.     Capillary Refill: Capillary refill takes less than 2 seconds.  Neurological:     General: No focal deficit present.     Mental Status: He is alert and oriented to person, place, and time.    A total of 25 minutes was spent in  the room with the patient, greater than 50% of which was in counseling/coordination of care regarding differential diagnosis, treatment, medications, prognosis, and need for follow-up if no better or worse.   ASSESSMENT & PLAN: [name] was seen today for cough.  Diagnoses and all orders for this visit:  Viral respiratory infection  Cough    Patient Instructions       If you have lab work done today you will be contacted with your lab results within the next 2 weeks.  If you have not heard from [place] then please contact us. The fastest way to get your results is to register for My Chart.   IF you received an x-ray today, you will receive an invoice from [facility]. Please contact [facility] at [phone] with questions or concerns regarding your invoice.   IF you received labwork today, you will receive an invoice from [place]. Please contact [facility] at [phone] with questions or concerns regarding your invoice.   Our billing staff will not be able to assist you with questions regarding bills from these companies.  You will be contacted with the lab results as soon as they are available. The fastest way to get your results is to activate your My Chart account. Instructions are located on the last page of this paperwork. If you have not heard from [place] regarding the results in 2 weeks, please contact this office.     Viral Respiratory Infection A viral respiratory infection is an illness that affects parts of the body that are used for breathing. These include the lungs, nose, and throat. It is caused by a germ called a virus. Some examples of this kind of infection are:  A cold.  The flu (influenza).  A respiratory syncytial virus (RSV) infection. A person who gets this illness may have the following symptoms:  A stuffy or runny nose.  Yellow or green fluid in the nose.  A cough.  Sneezing.  Tiredness (fatigue).  Achy muscles.  A sore  throat.  Sweating or chills.  A fever.  A headache. Follow these instructions at home: Managing pain and congestion  Take over-the-counter and prescription medicines only as told by your doctor.  If you have a sore throat, gargle with salt water. Do this 3-4 times per day or as needed. To make a salt-water mixture, dissolve -1 tsp of salt in 1 cup of warm water. Make sure that all the salt dissolves.  Use nose drops made from salt water. This helps with stuffiness (congestion). It also helps soften the skin around your nose.  Drink enough fluid to keep your pee (urine) pale yellow. General instructions   Rest as much as possible.  Do not drink alcohol.  Do not use any products that have nicotine or tobacco, such as cigarettes and e-cigarettes. If you need help quitting, ask your doctor.  Keep all follow-up visits as told by your doctor. This is important. How is this prevented?   Get a flu shot every year. Ask your doctor when you should get your flu shot.  Do not let other people get your germs. If you are sick: ? Stay home from work or school. ? Wash your hands with soap and water often. Wash your hands after you cough or sneeze. If soap and water are not available, use hand sanitizer.  Avoid contact with people who are sick during cold and flu season. This is in fall and winter. Get help if:  Your symptoms last for 10 days or longer.  Your symptoms get worse over time.  You have a fever.  You have very bad pain in your face or forehead.  Parts of your jaw or neck become very swollen. Get help right away if:  You feel pain or pressure in your chest.  You have shortness  of breath.  You faint or feel like you will faint.  You keep throwing up (vomiting).  You feel confused. Summary  A viral respiratory infection is an illness that affects parts of the body that are used for breathing.  Examples of this illness include a cold, the flu, and respiratory  syncytial virus (RSV) infection.  The infection can cause a runny nose, cough, sneezing, sore throat, and fever.  Follow what your doctor tells you about taking medicines, drinking lots of fluid, washing your hands, resting at home, and avoiding people who are sick. This information is not intended to replace advice given to you by your health care provider. Make sure you discuss any questions you have with your health care provider. Document Released: 09/10/2008 Document Revised: 11/08/2017 Document Reviewed: 11/08/2017 Elsevier Interactive Patient Education  2019 [employer].      Agustina Caroli, MD [facility]

## 2018-12-19 NOTE — Patient Instructions (Addendum)
If you have lab work done today you will be contacted with your lab results within the next 2 weeks.  If you have not heard from [place] then please contact us. The fastest way to get your results is to register for My Chart.   IF you received an x-ray today, you will receive an invoice from [facility]. Please contact [facility] at [phone] with questions or concerns regarding your invoice.   IF you received labwork today, you will receive an invoice from [employer]. Please contact [facility] at [phone] with questions or concerns regarding your invoice.   Our billing staff will not be able to assist you with questions regarding bills from these companies.  You will be contacted with the lab results as soon as they are available. The fastest way to get your results is to activate your My Chart account. Instructions are located on the last page of this paperwork. If you have not heard from [place] regarding the results in 2 weeks, please contact this office.     Viral Respiratory Infection A viral respiratory infection is an illness that affects parts of the body that are used for breathing. These include the lungs, nose, and throat. It is caused by a germ called a virus. Some examples of this kind of infection are:  A cold.  The flu (influenza).  A respiratory syncytial virus (RSV) infection. A person who gets this illness may have the following symptoms:  A stuffy or runny nose.  Yellow or green fluid in the nose.  A cough.  Sneezing.  Tiredness (fatigue).  Achy muscles.  A sore throat.  Sweating or chills.  A fever.  A headache. Follow these instructions at home: Managing pain and congestion  Take over-the-counter and prescription medicines only as told by your doctor.  If you have a sore throat, gargle with salt water. Do this 3-4 times per day or as needed. To make a salt-water mixture, dissolve -1 tsp of salt in 1 cup of warm water. Make  sure that all the salt dissolves.  Use nose drops made from salt water. This helps with stuffiness (congestion). It also helps soften the skin around your nose.  Drink enough fluid to keep your pee (urine) pale yellow. General instructions   Rest as much as possible.  Do not drink alcohol.  Do not use any products that have nicotine or tobacco, such as cigarettes and e-cigarettes. If you need help quitting, ask your doctor.  Keep all follow-up visits as told by your doctor. This is important. How is this prevented?   Get a flu shot every year. Ask your doctor when you should get your flu shot.  Do not let other people get your germs. If you are sick: ? Stay home from work or school. ? Wash your hands with soap and water often. Wash your hands after you cough or sneeze. If soap and water are not available, use hand sanitizer.  Avoid contact with people who are sick during cold and flu season. This is in fall and winter. Get help if:  Your symptoms last for 10 days or longer.  Your symptoms get worse over time.  You have a fever.  You have very bad pain in your face or forehead.  Parts of your jaw or neck become very swollen. Get help right away if:  You feel pain or pressure in your chest.  You have shortness of breath.  You faint or feel like you  will faint.  You keep throwing up (vomiting).  You feel confused. Summary  A viral respiratory infection is an illness that affects parts of the body that are used for breathing.  Examples of this illness include a cold, the flu, and respiratory syncytial virus (RSV) infection.  The infection can cause a runny nose, cough, sneezing, sore throat, and fever.  Follow what your doctor tells you about taking medicines, drinking lots of fluid, washing your hands, resting at home, and avoiding people who are sick. This information is not intended to replace advice given to you by your health care provider. Make sure you  discuss any questions you have with your health care provider. Document Released: 09/10/2008 Document Revised: 11/08/2017 Document Reviewed: 11/08/2017 Elsevier Interactive Patient Education  2019 [employer].

## 2021-09-28 ENCOUNTER — Encounter (HOSPITAL_COMMUNITY): Payer: Self-pay

## 2021-09-28 ENCOUNTER — Emergency Department (HOSPITAL_COMMUNITY)
Admission: EM | Admit: 2021-09-28 | Discharge: 2021-09-28 | Disposition: A | Discharge status: Home / Self Care | Payer: Medicare HMO | Attending: Emergency Medicine | Admitting: Emergency Medicine

## 2021-09-28 ENCOUNTER — Emergency Department (HOSPITAL_COMMUNITY): Payer: Medicare HMO

## 2021-09-28 DIAGNOSIS — Z7982 Long term (current) use of aspirin: Secondary | ICD-10-CM | POA: Insufficient documentation

## 2021-09-28 DIAGNOSIS — I10 Essential (primary) hypertension: Secondary | ICD-10-CM | POA: Diagnosis not present

## 2021-09-28 DIAGNOSIS — W01198A Fall on same level from slipping, tripping and stumbling with subsequent striking against other object, initial encounter: Secondary | ICD-10-CM | POA: Diagnosis not present

## 2021-09-28 DIAGNOSIS — Z8673 Personal history of transient ischemic attack (TIA), and cerebral infarction without residual deficits: Secondary | ICD-10-CM | POA: Insufficient documentation

## 2021-09-28 DIAGNOSIS — S022XXB Fracture of nasal bones, initial encounter for open fracture: Secondary | ICD-10-CM | POA: Diagnosis not present

## 2021-09-28 DIAGNOSIS — W19XXXA Unspecified fall, initial encounter: Secondary | ICD-10-CM

## 2021-09-28 DIAGNOSIS — S0990XA Unspecified injury of head, initial encounter: Secondary | ICD-10-CM

## 2021-09-28 DIAGNOSIS — I2510 Atherosclerotic heart disease of native coronary artery without angina pectoris: Secondary | ICD-10-CM | POA: Diagnosis not present

## 2021-09-28 DIAGNOSIS — Z951 Presence of aortocoronary bypass graft: Secondary | ICD-10-CM | POA: Insufficient documentation

## 2021-09-28 DIAGNOSIS — Z79899 Other long term (current) drug therapy: Secondary | ICD-10-CM | POA: Diagnosis not present

## 2021-09-28 DIAGNOSIS — Z23 Encounter for immunization: Secondary | ICD-10-CM | POA: Diagnosis not present

## 2021-09-28 DIAGNOSIS — S0081XA Abrasion of other part of head, initial encounter: Secondary | ICD-10-CM | POA: Insufficient documentation

## 2021-09-28 MED ORDER — TETANUS-DIPHTH-ACELL PERTUSSIS 5-2.5-18.5 LF-MCG/0.5 IM SUSY
0.5000 mL | PREFILLED_SYRINGE | Freq: Once | INTRAMUSCULAR | Status: AC
  Administered 2021-09-28: 02:00:00 0.5 mL via INTRAMUSCULAR
  Filled 2021-09-28: qty 0.5

## 2021-09-28 MED ORDER — AMOXICILLIN-POT CLAVULANATE 875-125 MG PO TABS: 1.0000 | ORAL_TABLET | Freq: Two times a day (BID) | ORAL | 0 refills | Status: AC

## 2021-09-28 MED ORDER — AMOXICILLIN-POT CLAVULANATE 875-125 MG PO TABS
1.0000 | ORAL_TABLET | Freq: Once | ORAL | Status: AC
  Administered 2021-09-28: 09:00:00 1 via ORAL
  Filled 2021-09-28: qty 1

## 2021-09-28 NOTE — ED Provider Notes (Signed)
[facility] Provider Note   CSN: [id number] Arrival date & time: 09/28/21  0053     History Chief Complaint  Patient presents with   Fall   Head Injury    BLANCHARD WILLHITE is a 87 y.o. male.  Patient is a 87 year old male with a history of GERD, coronary artery disease, prior stroke, hypertension, hyperlipidemia who presents with facial injuries after a fall.  He actually just arrived here from [facility].  He is here visiting family for the holidays.  He tripped on a curb and fell forward striking his face on the concrete.  There was no loss of consciousness.  No ongoing nausea or vomiting.  He has some injuries to his face.  His tetanus shot was updated tonight in the ED.  He denies any other injuries from the fall.  No neck or back pain.  No dizziness, chest pain, shortness of breath or other symptoms that preceded the fall.  Per family, has had 3 prior broken noses.      Past Medical History:  Diagnosis Date   Cerebrovascular disease    Coronary artery disease    GERD (gastroesophageal reflux disease)    Heart attack ([place])    Hx of colonic polyps    Hyperlipidemia    Hypertension    Premature ventricular contractions    PSA elevation 02/20/10   PSA=1.7 with Dr. Rosana Hoes    Patient Active Problem List   Diagnosis Date Noted   Viral respiratory infection 12/19/2018   Eustachian tube dysfunction 07/04/2016   Abdominal aneurysm without mention of rupture 07/07/2010   Essential hypertension 06/27/2010   GERD 06/27/2010   HYPERLIPIDEMIA 05/23/2009   Coronary atherosclerosis 05/23/2009   PREMATURE VENTRICULAR CONTRACTIONS 05/23/2009   CEREBROVASCULAR DISEASE 05/23/2009    Past Surgical History:  Procedure Laterality Date   APPENDECTOMY     CHOLECYSTECTOMY  Nov 2011   CORONARY ARTERY BYPASS GRAFT  August 1996   LIMA to the LAD and diagonal, saphenous vein graft to the intermediate, obtuse marginale and circumflex, and a SVG to the RCA  and PDA   EYE SURGERY     TONSILLECTOMY         Family History  Problem Relation Age of Onset   Prostate cancer Unknown        1st degree relative <50   Cancer Other        1st degree relative    Social History   Tobacco Use   Smoking status: Never   Smokeless tobacco: Never  Substance Use Topics   Alcohol use: No   Drug use: No    Home Medications Prior to Admission medications   Medication Sig Start Date End Date Taking? Authorizing Provider  amoxicillin-clavulanate (AUGMENTIN) 875-125 MG tablet Take 1 tablet by mouth 2 (two) times daily. One po bid x 7 days 09/28/21  Yes Malvin Johns, MD  aspirin 81 MG tablet Take 81 mg by mouth daily.      [provider]  atorvastatin (LIPITOR) 20 MG tablet Take 1 tablet (20 mg total) by mouth daily. 08/03/17   Lelon Perla, MD  Calcium 600-200 MG-UNIT per tablet Take 1 tablet by mouth daily.      [provider]  multivitamin [facility]) per tablet Take 1 tablet by mouth daily.      [provider]  ramipril (ALTACE) 10 MG capsule TAKE 1 CAPSULE BY MOUTH EVERY DAY 04/18/18   Lelon Perla, MD  vitamin C (  ASCORBIC ACID) 500 MG tablet Take 500 mg by mouth daily.      [provider]    Allergies    Patient has no known allergies.  Review of Systems   Review of Systems  Constitutional:  Negative for activity change, appetite change and fever.  HENT:  Positive for facial swelling. Negative for dental problem, nosebleeds and trouble swallowing.   Eyes:  Negative for pain and visual disturbance.  Respiratory:  Negative for shortness of breath.   Cardiovascular:  Negative for chest pain.  Gastrointestinal:  Negative for abdominal pain, nausea and vomiting.  Genitourinary:  Negative for dysuria and hematuria.  Musculoskeletal:  Negative for arthralgias, back pain, joint swelling and neck pain.  Skin:  Positive for wound.  Neurological:  Negative for weakness, numbness and headaches.   Psychiatric/Behavioral:  Negative for confusion.    Physical Exam Updated Vital Signs BP (!) 160/88 (BP Location: Left Arm)    Pulse 91    Temp 97.6 F (36.4 C) (Oral)    Resp 18    Ht 5\' 10"  (1.778 m)    Wt 88.8 kg    SpO2 97%    BMI 28.09 kg/m   Physical Exam Constitutional:      Appearance: He is well-developed.  HENT:     Head: Normocephalic.     Comments: Abrasions to his forehead and the bridge of his nose.  There is some skin missing.  No active bleeding.  No wounds that are amenable to suturing.  Patient has some swelling and tenderness to his nose.  There are some ecchymosis under his left eye.  Extraocular movements are intact.  No other bony tenderness to his face. Eyes:     Pupils: Pupils are equal, round, and reactive to light.  Neck:     Comments: No pain to the cervical, thoracic or lumbosacral spine Cardiovascular:     Rate and Rhythm: Normal rate and regular rhythm.     Heart sounds: Normal heart sounds.  Pulmonary:     Effort: Pulmonary effort is normal. No respiratory distress.     Breath sounds: Normal breath sounds. No wheezing or rales.  Chest:     Chest wall: No tenderness.  Abdominal:     General: Bowel sounds are normal.     Palpations: Abdomen is soft.     Tenderness: There is no abdominal tenderness. There is no guarding or rebound.  Musculoskeletal:        General: Normal range of motion.     Cervical back: Normal range of motion and neck supple.     Comments: No pain on palpation or range of motion of the extremities including the hips.  There is a small abrasion to his right thumb.  No underlying bony tenderness.  He has full range of motion.  Lymphadenopathy:     Cervical: No cervical adenopathy.  Skin:    General: Skin is warm and dry.     Findings: No rash.  Neurological:     Mental Status: He is alert and oriented to person, place, and time.    ED Results / Procedures / Treatments   Labs (all labs ordered are listed, but only abnormal  results are displayed) Labs Reviewed - No data to display  EKG None  Radiology CT HEAD WO CONTRAST (5MM)  Result Date: 09/28/2021 CLINICAL DATA:  Fall, head injury, scalp laceration. Facial trauma, blunt; Neck trauma (Age >= 65y); Head trauma, minor (Age >= 65y) EXAM: CT HEAD WITHOUT  CONTRAST CT MAXILLOFACIAL WITHOUT CONTRAST CT CERVICAL SPINE WITHOUT CONTRAST TECHNIQUE: Multidetector CT imaging of the head, cervical spine, and maxillofacial structures were performed using the standard protocol without intravenous contrast. Multiplanar CT image reconstructions of the cervical spine and maxillofacial structures were also generated. COMPARISON:  02/06/2016 FINDINGS: CT HEAD FINDINGS Brain: Normal anatomic configuration. Parenchymal volume loss is commensurate with the patient's age. No abnormal intra or extra-axial mass lesion or fluid collection. No abnormal mass effect or midline shift. No evidence of acute intracranial hemorrhage or infarct. Ventricular size is normal. Cerebellum unremarkable. Vascular: No asymmetric hyperdense vasculature at the skull base. Skull: Intact Other: Mastoid air cells and middle ear cavities are clear. Mild prefrontal scalp soft tissue swelling. CT MAXILLOFACIAL FINDINGS Osseous: There is chronic deformity of the nasal bridge again identified, however, there is superimposed acute fracture of the nasal bones bilaterally with mild displacement. Orbits: The ocular lenses have been removed. The orbits are otherwise unremarkable. Sinuses: The paranasal sinuses are clear. Soft tissues: Mild soft tissue swelling superficial to the nasal bridge. CT CERVICAL SPINE FINDINGS Alignment: Normal.  No listhesis. Skull base and vertebrae: Craniocervical alignment is normal. The atlantodental interval is not widened. No acute fracture of the cervical spine. Soft tissues and spinal canal: Posterior disc osteophyte complex in combination with mild narrowing of the central spinal canal results in  mild central canal stenosis at C5-6 with an AP diameter of approximately 8-9 mm. Mild flattening of the thecal sac. Milder changes are noted at C3-4 and C4-5. No canal hematoma. No prevertebral soft tissue swelling or fluid collections. Disc levels: Intervertebral disc space narrowing and endplate remodeling at K8-1 in keeping with moderate degenerative disc disease. Milder degenerative changes are noted throughout the remainder of the cervical spine. Prevertebral soft tissues are not thickened on sagittal reformats. Review of the axial images demonstrates multilevel uncovertebral and facet arthrosis resulting in moderate to severe neuroforaminal narrowing on the left at C3-4 and C4-5, bilaterally at C5-6 and on the left at C6-7. Upper chest: Unremarkable Other: None IMPRESSION: No acute intracranial abnormality. No calvarial fracture. Mild frontal scalp soft tissue swelling. Acute, mildly displaced fracture of the nasal bones bilaterally. Mild overlying soft tissue swelling. No acute fracture or listhesis of the cervical spine. Multilevel degenerative disc and degenerative joint disease within the cervical spine resulting in multilevel mild central canal stenosis and moderate to severe neuroforaminal narrowing as described above. Electronically Signed   By: Fidela Salisbury M.D.   On: 09/28/2021 01:49   CT Cervical Spine Wo Contrast  Result Date: 09/28/2021 CLINICAL DATA:  Fall, head injury, scalp laceration. Facial trauma, blunt; Neck trauma (Age >= 65y); Head trauma, minor (Age >= 65y) EXAM: CT HEAD WITHOUT CONTRAST CT MAXILLOFACIAL WITHOUT CONTRAST CT CERVICAL SPINE WITHOUT CONTRAST TECHNIQUE: Multidetector CT imaging of the head, cervical spine, and maxillofacial structures were performed using the standard protocol without intravenous contrast. Multiplanar CT image reconstructions of the cervical spine and maxillofacial structures were also generated. COMPARISON:  02/06/2016 FINDINGS: CT HEAD FINDINGS  Brain: Normal anatomic configuration. Parenchymal volume loss is commensurate with the patient's age. No abnormal intra or extra-axial mass lesion or fluid collection. No abnormal mass effect or midline shift. No evidence of acute intracranial hemorrhage or infarct. Ventricular size is normal. Cerebellum unremarkable. Vascular: No asymmetric hyperdense vasculature at the skull base. Skull: Intact Other: Mastoid air cells and middle ear cavities are clear. Mild prefrontal scalp soft tissue swelling. CT MAXILLOFACIAL FINDINGS Osseous: There is chronic deformity of the nasal bridge  again identified, however, there is superimposed acute fracture of the nasal bones bilaterally with mild displacement. Orbits: The ocular lenses have been removed. The orbits are otherwise unremarkable. Sinuses: The paranasal sinuses are clear. Soft tissues: Mild soft tissue swelling superficial to the nasal bridge. CT CERVICAL SPINE FINDINGS Alignment: Normal.  No listhesis. Skull base and vertebrae: Craniocervical alignment is normal. The atlantodental interval is not widened. No acute fracture of the cervical spine. Soft tissues and spinal canal: Posterior disc osteophyte complex in combination with mild narrowing of the central spinal canal results in mild central canal stenosis at C5-6 with an AP diameter of approximately 8-9 mm. Mild flattening of the thecal sac. Milder changes are noted at C3-4 and C4-5. No canal hematoma. No prevertebral soft tissue swelling or fluid collections. Disc levels: Intervertebral disc space narrowing and endplate remodeling at H6-7 in keeping with moderate degenerative disc disease. Milder degenerative changes are noted throughout the remainder of the cervical spine. Prevertebral soft tissues are not thickened on sagittal reformats. Review of the axial images demonstrates multilevel uncovertebral and facet arthrosis resulting in moderate to severe neuroforaminal narrowing on the left at C3-4 and C4-5,  bilaterally at C5-6 and on the left at C6-7. Upper chest: Unremarkable Other: None IMPRESSION: No acute intracranial abnormality. No calvarial fracture. Mild frontal scalp soft tissue swelling. Acute, mildly displaced fracture of the nasal bones bilaterally. Mild overlying soft tissue swelling. No acute fracture or listhesis of the cervical spine. Multilevel degenerative disc and degenerative joint disease within the cervical spine resulting in multilevel mild central canal stenosis and moderate to severe neuroforaminal narrowing as described above. Electronically Signed   By: Fidela Salisbury M.D.   On: 09/28/2021 01:49   CT Maxillofacial Wo Contrast  Result Date: 09/28/2021 CLINICAL DATA:  Fall, head injury, scalp laceration. Facial trauma, blunt; Neck trauma (Age >= 65y); Head trauma, minor (Age >= 65y) EXAM: CT HEAD WITHOUT CONTRAST CT MAXILLOFACIAL WITHOUT CONTRAST CT CERVICAL SPINE WITHOUT CONTRAST TECHNIQUE: Multidetector CT imaging of the head, cervical spine, and maxillofacial structures were performed using the standard protocol without intravenous contrast. Multiplanar CT image reconstructions of the cervical spine and maxillofacial structures were also generated. COMPARISON:  02/06/2016 FINDINGS: CT HEAD FINDINGS Brain: Normal anatomic configuration. Parenchymal volume loss is commensurate with the patient's age. No abnormal intra or extra-axial mass lesion or fluid collection. No abnormal mass effect or midline shift. No evidence of acute intracranial hemorrhage or infarct. Ventricular size is normal. Cerebellum unremarkable. Vascular: No asymmetric hyperdense vasculature at the skull base. Skull: Intact Other: Mastoid air cells and middle ear cavities are clear. Mild prefrontal scalp soft tissue swelling. CT MAXILLOFACIAL FINDINGS Osseous: There is chronic deformity of the nasal bridge again identified, however, there is superimposed acute fracture of the nasal bones bilaterally with mild  displacement. Orbits: The ocular lenses have been removed. The orbits are otherwise unremarkable. Sinuses: The paranasal sinuses are clear. Soft tissues: Mild soft tissue swelling superficial to the nasal bridge. CT CERVICAL SPINE FINDINGS Alignment: Normal.  No listhesis. Skull base and vertebrae: Craniocervical alignment is normal. The atlantodental interval is not widened. No acute fracture of the cervical spine. Soft tissues and spinal canal: Posterior disc osteophyte complex in combination with mild narrowing of the central spinal canal results in mild central canal stenosis at C5-6 with an AP diameter of approximately 8-9 mm. Mild flattening of the thecal sac. Milder changes are noted at C3-4 and C4-5. No canal hematoma. No prevertebral soft tissue swelling or fluid collections. Disc  levels: Intervertebral disc space narrowing and endplate remodeling at T2-6 in keeping with moderate degenerative disc disease. Milder degenerative changes are noted throughout the remainder of the cervical spine. Prevertebral soft tissues are not thickened on sagittal reformats. Review of the axial images demonstrates multilevel uncovertebral and facet arthrosis resulting in moderate to severe neuroforaminal narrowing on the left at C3-4 and C4-5, bilaterally at C5-6 and on the left at C6-7. Upper chest: Unremarkable Other: None IMPRESSION: No acute intracranial abnormality. No calvarial fracture. Mild frontal scalp soft tissue swelling. Acute, mildly displaced fracture of the nasal bones bilaterally. Mild overlying soft tissue swelling. No acute fracture or listhesis of the cervical spine. Multilevel degenerative disc and degenerative joint disease within the cervical spine resulting in multilevel mild central canal stenosis and moderate to severe neuroforaminal narrowing as described above. Electronically Signed   By: Fidela Salisbury M.D.   On: 09/28/2021 01:49    Procedures Procedures   Medications Ordered in  ED Medications  amoxicillin-clavulanate (AUGMENTIN) 875-125 MG per tablet 1 tablet (has no administration in time range)  Tdap (BOOSTRIX) injection 0.5 mL (0.5 mLs Intramuscular Given 09/28/21 0151)    ED Course  I have reviewed the triage vital signs and the nursing notes.  Pertinent labs & imaging results that were available during my care of the patient were reviewed by me and considered in my medical decision making (see chart for details).    MDM Rules/Calculators/A&P                         Patient status post mechanical fall with facial injuries.  He has nasal bone fractures but no other facial bone fractures are noted.  No evidence of intracranial hemorrhage.  His CT of the cervical spine was obtained without acute traumatic findings.  His tetanus shot was updated.  His wounds were cleaned.  He was placed on Augmentin given that he has some open wounds to his nose in association with fractures.  Appears to be mostly abrasions without suturable wounds.  He was instructed that he will need to follow-up with an ear nose and throat doctor.  He and his son were notified of this.  He will arrange this through his primary care doctor when she returns to [facility] in about a week and a half.  Return precautions were given.    Final Clinical Impression(s) / ED Diagnoses Final diagnoses:  Injury of head, initial encounter  Fall, initial encounter  Abrasion of face, initial encounter  Open fracture of nasal bone, initial encounter    Rx / DC Orders ED Discharge Orders          Ordered    amoxicillin-clavulanate (AUGMENTIN) 875-125 MG tablet  2 times daily        09/28/21 0840             Malvin Johns, MD 09/28/21 [phone]

## 2021-09-28 NOTE — Discharge Instructions (Addendum)
You can use antibiotic ointment to your facial wounds.  Take that antibiotic by mouth as discussed.  Once you get back to [facility], you will need to follow-up with an ear nose and throat doctor.  Talk to your primary care doctor regarding helping to make these arrangements.  Return to emergency room if you have any worsening symptoms.

## 2021-09-28 NOTE — ED Triage Notes (Signed)
Patient BIB EMS from home with report of fall that took place 30 mins ago. Pt fell on concrete and hit his head, laceration on forehead and bridge of nose. Bleeding controlled, no blood thinners, no LOC.

## 2021-09-28 NOTE — ED Provider Notes (Addendum)
Emergency Medicine Provider Triage Evaluation Note  [name] , a 87 y.o. male  was evaluated in triage.  Pt complains of fall with head injury.  States he tripped and fell about 30 minutes ago, hit face first on concrete.  There was no loss of consciousness.  He takes baby aspirin but no other anticoagulation.  Numerous abrasions to the face.  Denies any dizziness, nausea, vomiting.  Denies confusion, numbness, weakness  Review of Systems  Positive: fall Negative: Dizziness, weakness  Physical Exam  BP (!) 160/88 (BP Location: Left Arm)    Pulse 91    Ht 5\' 10"  (1.778 m)    Wt 88.8 kg    SpO2 97%    BMI 28.09 kg/m   Gen:   Awake, no distress   Resp:  Normal effort  MSK:   Moves extremities without difficulty  Other:  Numerous abrasions to the forehead, bridge of nose  Medical Decision Making  Medically screening exam initiated at 1:08 AM.  Appropriate orders placed.  [name] was informed that the remainder of the evaluation will be completed by another provider, this initial triage assessment does not replace that evaluation, and the importance of remaining in the ED until their evaluation is complete.  Mechanical fall with head trauma.  AAOx3 currently.  On baby ASA.  Numerous abrasions to the face.  Given his age, will obtain CT head/face/neck.  Tetanus not UTD (was due 2021).   Darren Pickett, PA-C 09/28/21 0110    Darren Pickett, PA-C 09/28/21 0111    Darren Pickett, PA-C 09/28/21 0111    Darren Speak, MD 09/28/21 [phone]

## 2022-09-06 IMAGING — CT CT CERVICAL SPINE W/O CM
2 of 4 series · 9 of 33 positions shown, 11 images · non-contrast
Comparison: 02/06/2016

CLINICAL DATA: Fall, head injury, scalp laceration. Facial trauma,
blunt; Neck trauma (Age >= 65y); Head trauma, minor (Age >= 65y)

EXAM:
CT HEAD WITHOUT CONTRAST
CT MAXILLOFACIAL WITHOUT CONTRAST
CT CERVICAL SPINE WITHOUT CONTRAST
TECHNIQUE: Multidetector CT imaging of the head, cervical spine, and
maxillofacial structures were performed using the standard protocol
without intravenous contrast. Multiplanar CT image reconstructions
of the cervical spine and maxillofacial structures were also
generated.

[Series 6: orthogonal axials · axial · 0.23mm/px · z∈[-351,-160]mm · 6 of 149 slices shown, 8 images]
[im 22/149  soft-tissue]
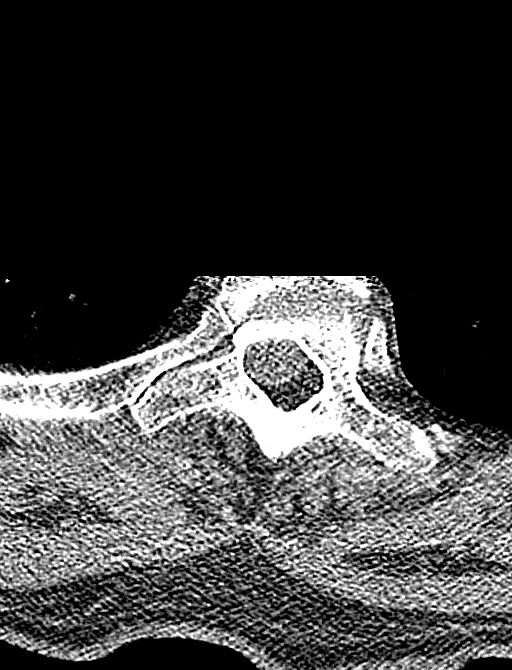
[im 22/149  bone]
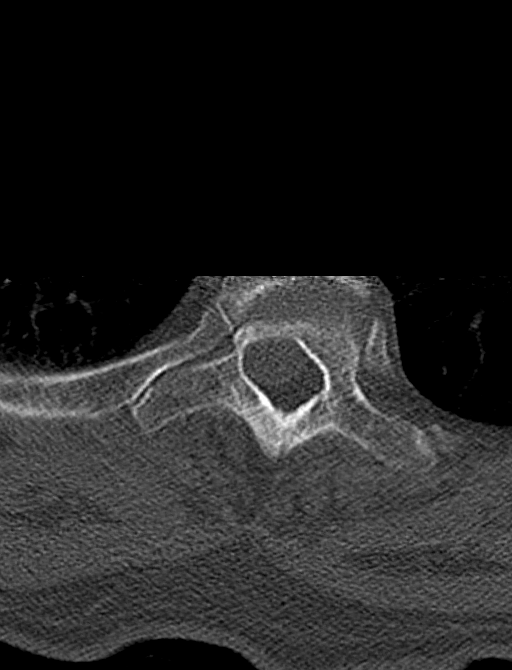
[im 43/149  bone]
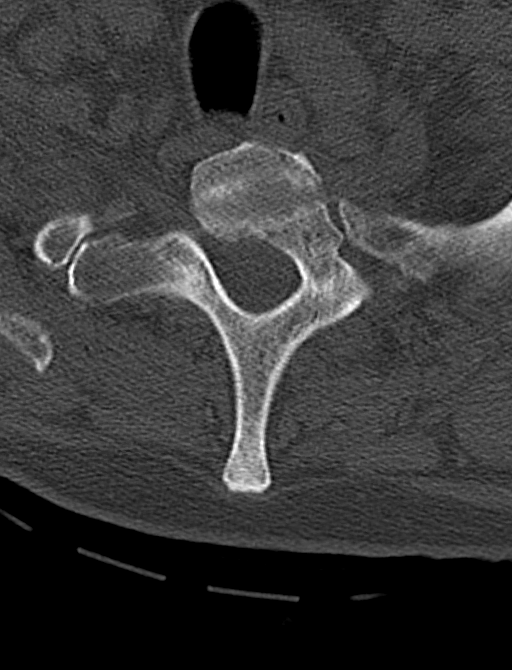
[im 64/149  bone]
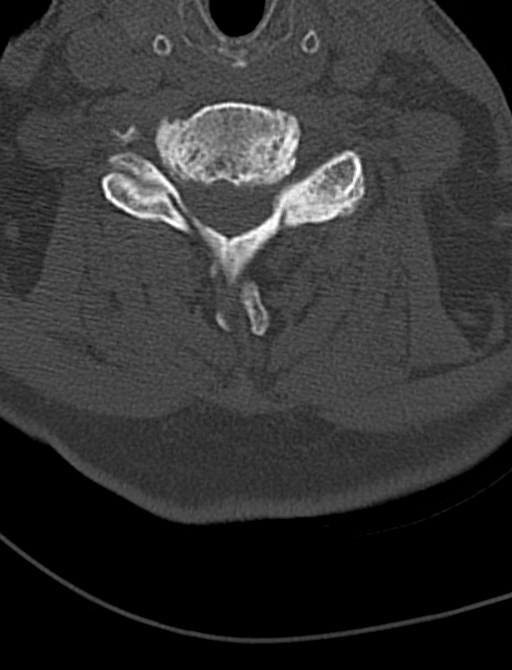
[im 85/149  bone]
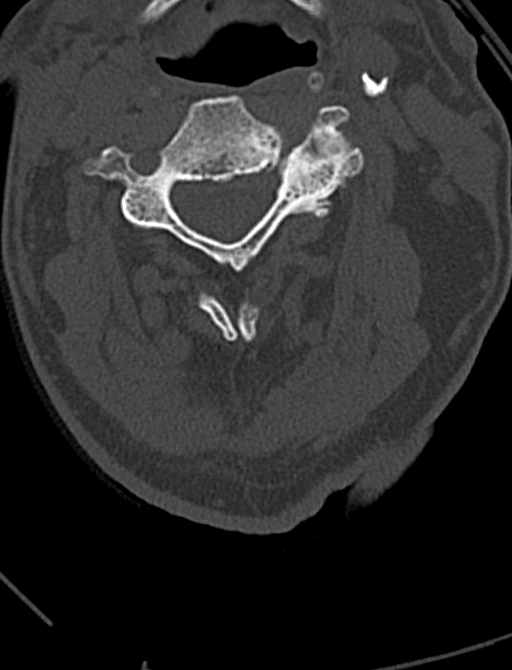
[im 106/149  soft-tissue]
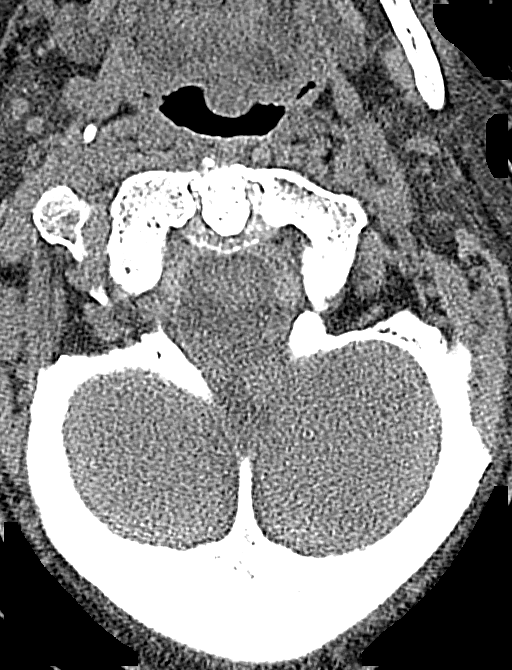
[im 106/149  bone]
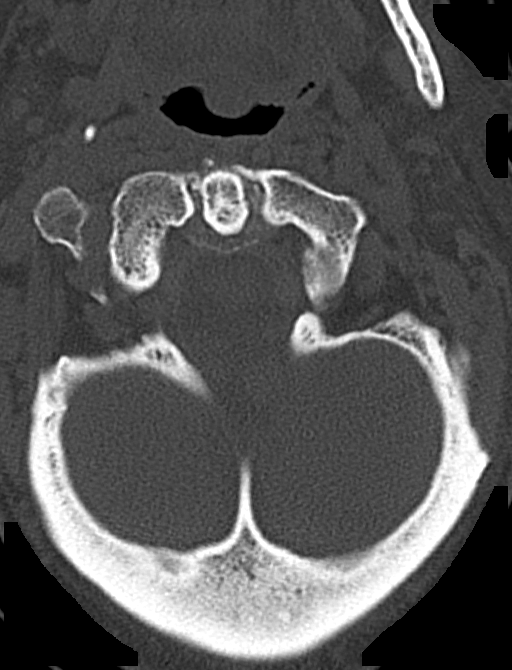
[im 127/149  bone]
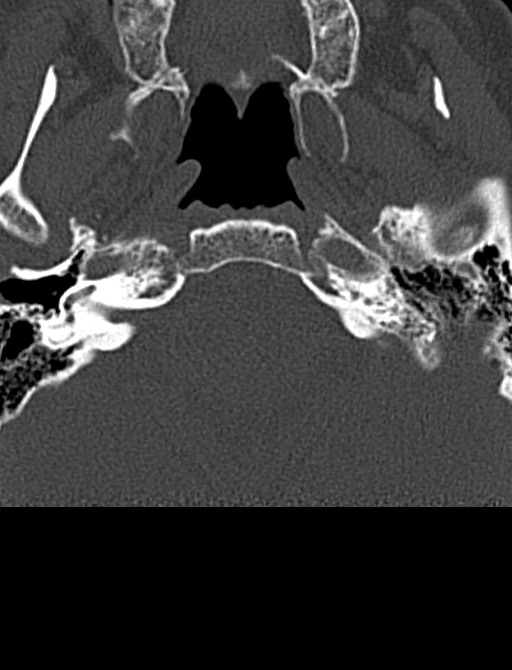

[Series 7: coronal bone · coronal · 0.23mm/px · 3 of 79 slices shown]
[im 22/79  bone]
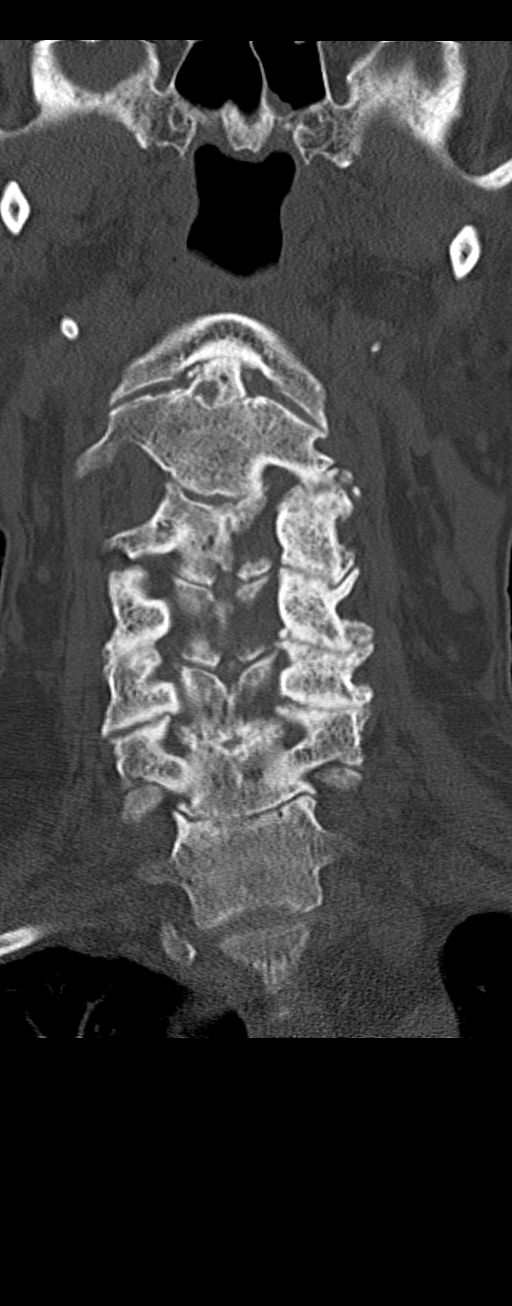
[im 34/79  bone]
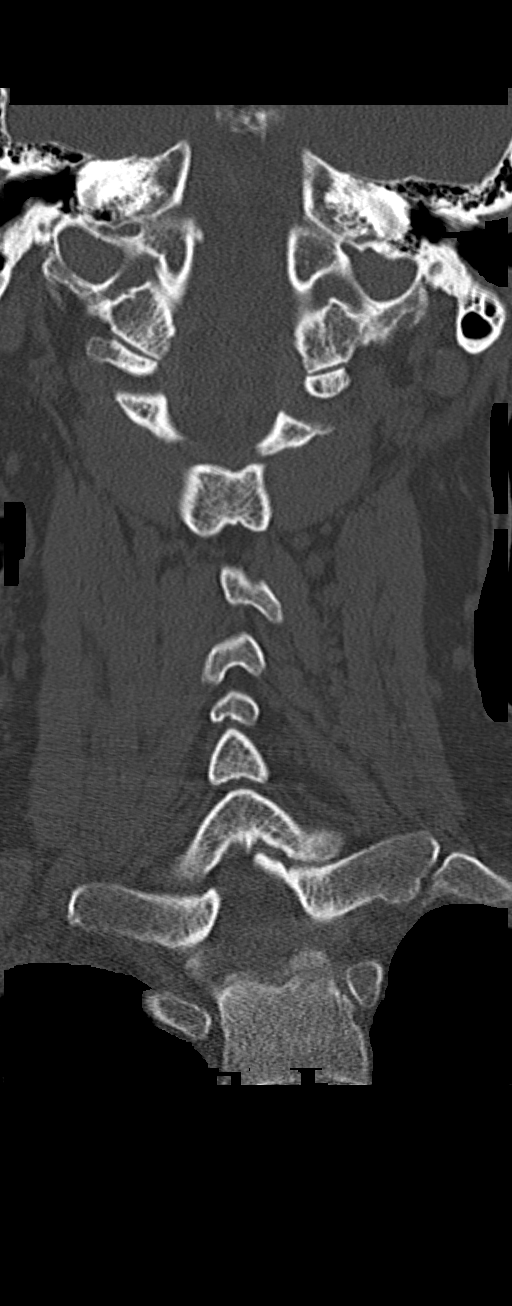
[im 45/79  bone]
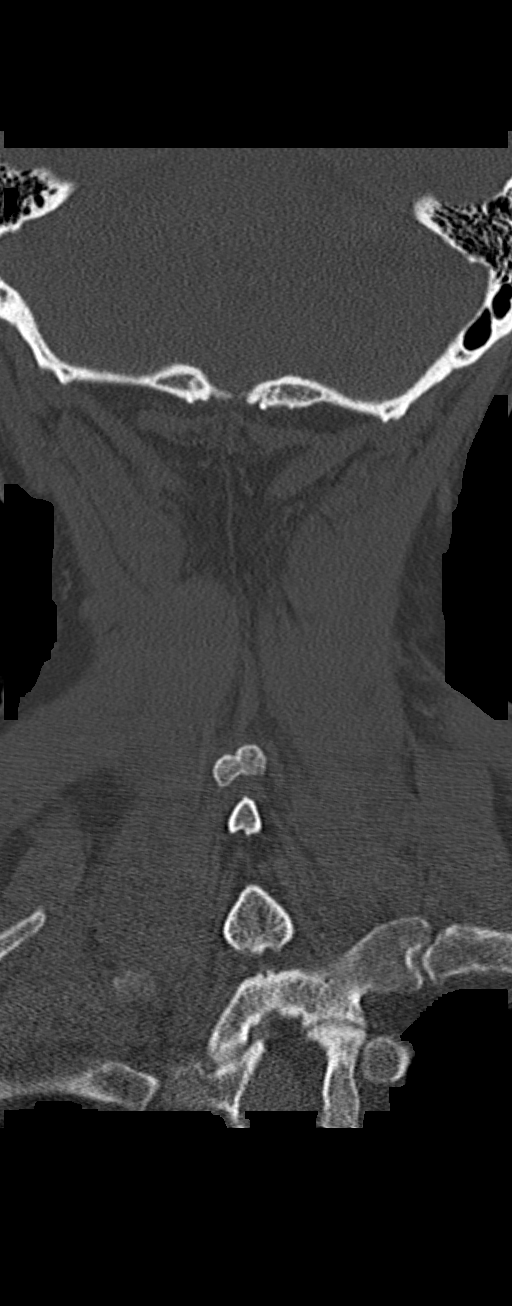

[9 of 33 positions shown; findings below may reference images not displayed]

FINDINGS: CT HEAD FINDINGS

Brain: Normal anatomic configuration. Parenchymal volume loss is
commensurate with the patient's age. No abnormal intra or
extra-axial mass lesion or fluid collection. No abnormal mass effect
or midline shift. No evidence of acute intracranial hemorrhage or
infarct. Ventricular size is normal. Cerebellum unremarkable.

Vascular: No asymmetric hyperdense vasculature at the skull base.

Skull: Intact

Other: Mastoid air cells and middle ear cavities are clear. Mild
prefrontal scalp soft tissue swelling.

CT MAXILLOFACIAL FINDINGS

Osseous: There is chronic deformity of the nasal bridge again
identified, however, there is superimposed acute fracture of the
nasal bones bilaterally with mild displacement.

Orbits: The ocular lenses have been removed. The orbits are
otherwise unremarkable.

Sinuses: The paranasal sinuses are clear.

Soft tissues: Mild soft tissue swelling superficial to the nasal
bridge.

CT CERVICAL SPINE FINDINGS

Alignment: Normal.  No listhesis.

Skull base and vertebrae: Craniocervical alignment is normal. The
atlantodental interval is not widened. No acute fracture of the
cervical spine.

Soft tissues and spinal canal: Posterior disc osteophyte complex in
combination with mild narrowing of the central spinal canal results
in mild central canal stenosis at C5-6 with an AP diameter of
approximately 8-9 mm. Mild flattening of the thecal sac. Milder
changes are noted at C3-4 and C4-5. No canal hematoma. No
prevertebral soft tissue swelling or fluid collections.

Disc levels: Intervertebral disc space narrowing and endplate
remodeling at C5-6 in keeping with moderate degenerative disc
disease. Milder degenerative changes are noted throughout the
remainder of the cervical spine. Prevertebral soft tissues are not
thickened on sagittal reformats. Review of the axial images
demonstrates multilevel uncovertebral and facet arthrosis resulting
in moderate to severe neuroforaminal narrowing on the left at C3-4
and C4-5, bilaterally at C5-6 and on the left at C6-7.

Upper chest: Unremarkable

Other: None
IMPRESSION: No acute intracranial abnormality. No calvarial fracture. Mild
frontal scalp soft tissue swelling.

Acute, mildly displaced fracture of the nasal bones bilaterally.
Mild overlying soft tissue swelling.

No acute fracture or listhesis of the cervical spine.

Multilevel degenerative disc and degenerative joint disease within
the cervical spine resulting in multilevel mild central canal
stenosis and moderate to severe neuroforaminal narrowing as
described above.

## 2022-09-06 IMAGING — CT CT MAXILLOFACIAL W/O CM
3 series · 14 of 47 positions shown, 16 images · non-contrast
Comparison: 02/06/2016

CLINICAL DATA: Fall, head injury, scalp laceration. Facial trauma,
blunt; Neck trauma (Age >= 65y); Head trauma, minor (Age >= 65y)

EXAM:
CT HEAD WITHOUT CONTRAST
CT MAXILLOFACIAL WITHOUT CONTRAST
CT CERVICAL SPINE WITHOUT CONTRAST
TECHNIQUE: Multidetector CT imaging of the head, cervical spine, and
maxillofacial structures were performed using the standard protocol
without intravenous contrast. Multiplanar CT image reconstructions
of the cervical spine and maxillofacial structures were also
generated.

[Series 3: max soft · axial · 0.38mm/px · z∈[-226,-72]mm · 8 of 91 slices shown, 10 images]
[im 7/91  brain]
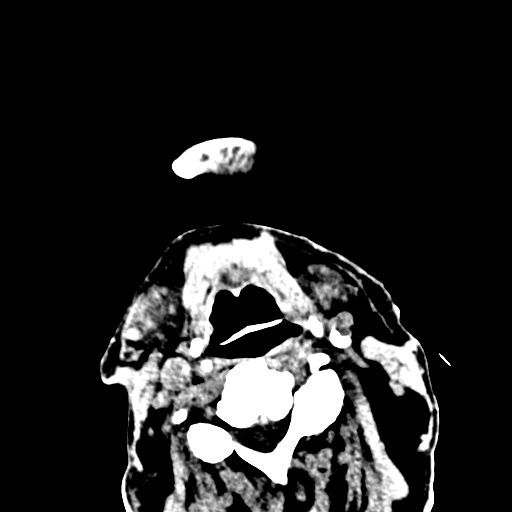
[im 7/91  bone]
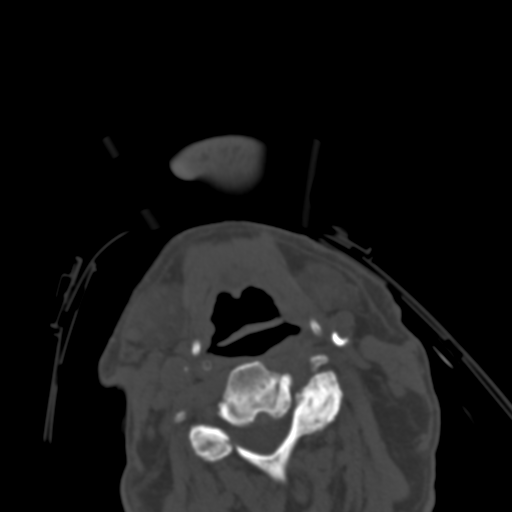
[im 19/91  bone]
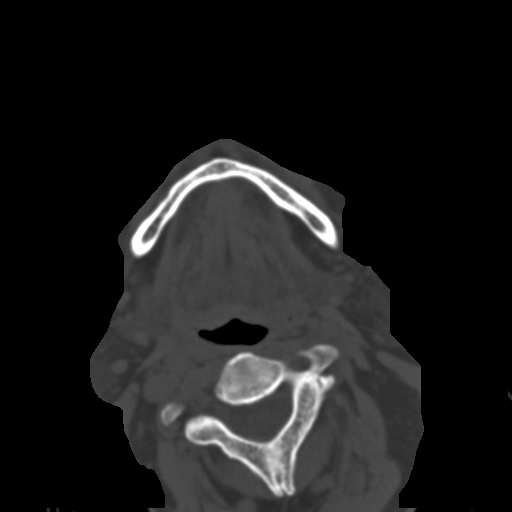
[im 28/91  bone]
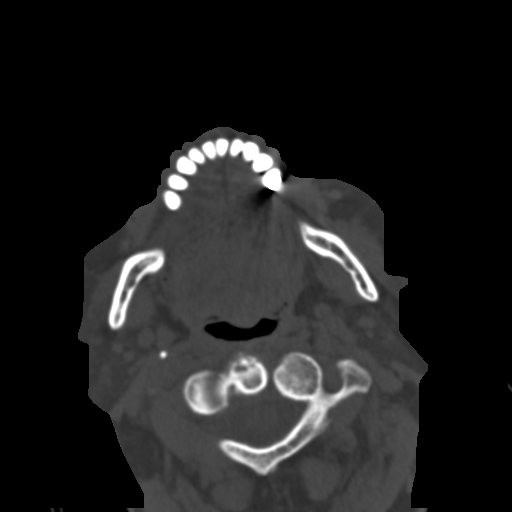
[im 41/91  bone]
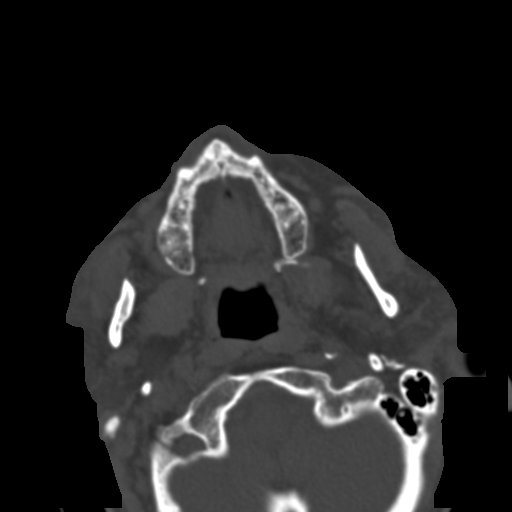
[im 50/91  brain]
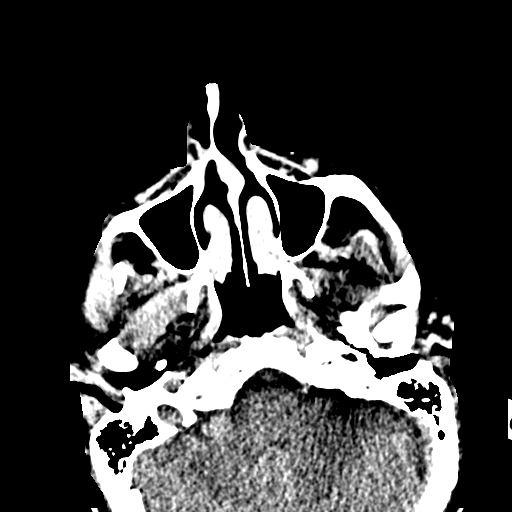
[im 50/91  bone]
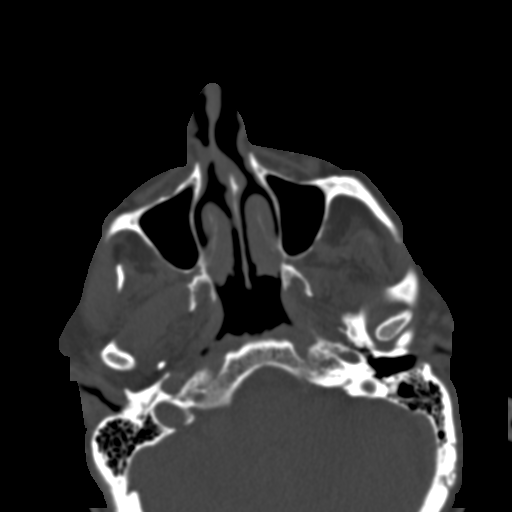
[im 63/91  bone]
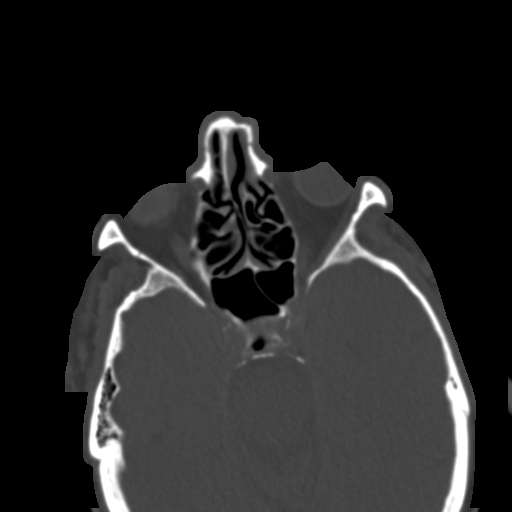
[im 72/91  bone]
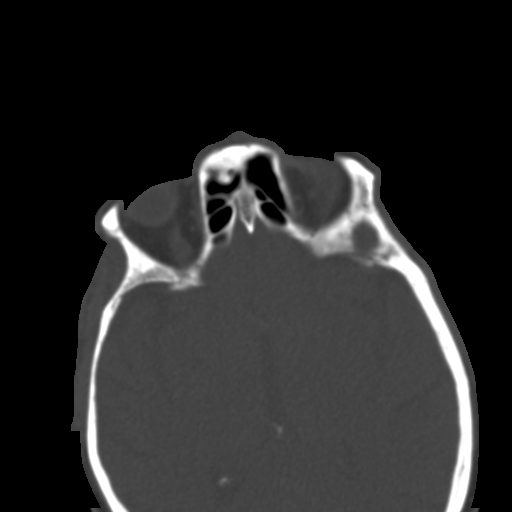
[im 84/91  bone]
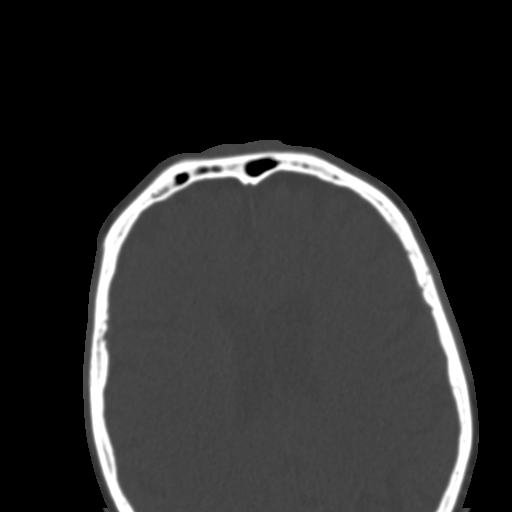

[Series 7: coronal soft · coronal · 0.37mm/px · 3 of 93 slices shown]
[im 31/93  bone]
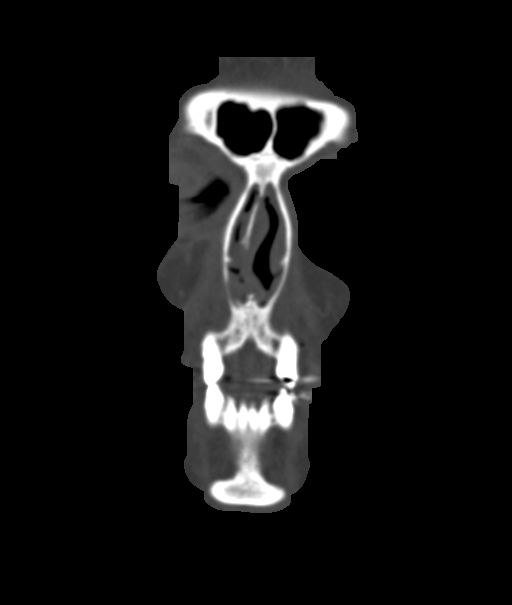
[im 41/93  bone]
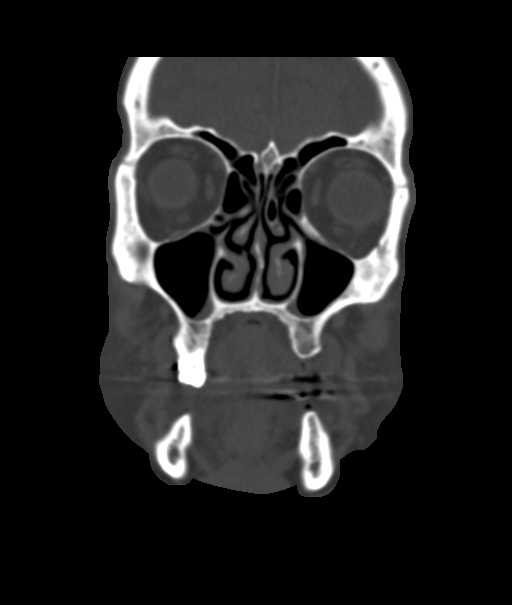
[im 52/93  bone]
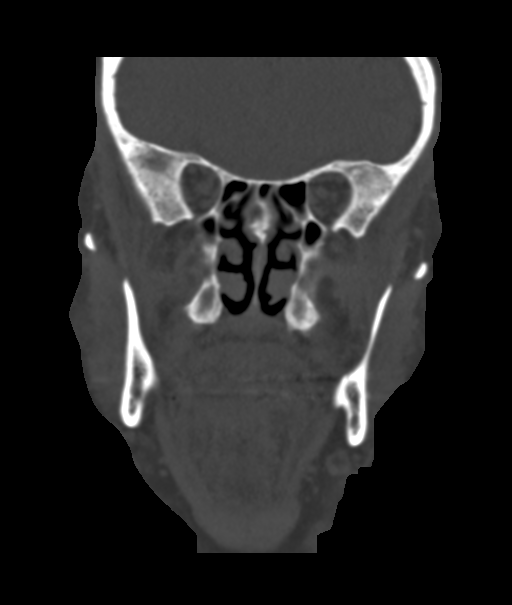

[Series 8: sagittal soft · sagittal · 0.37mm/px · 3 of 94 slices shown]
[im 32/94  bone]
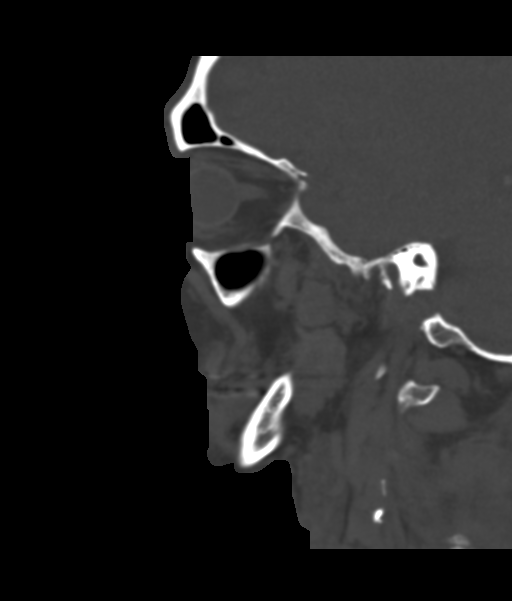
[im 47/94  bone]
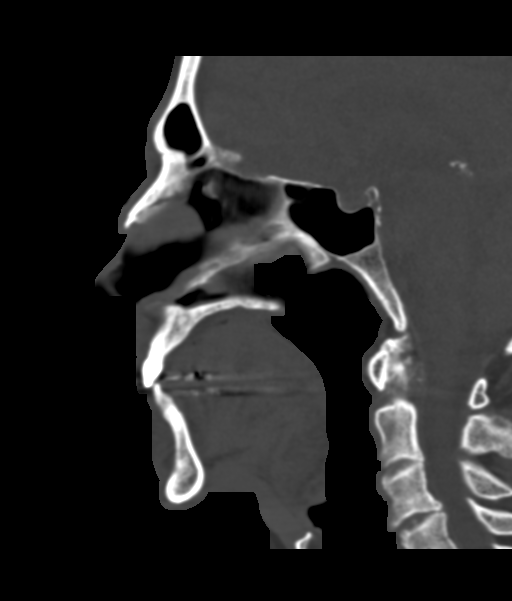
[im 63/94  bone]
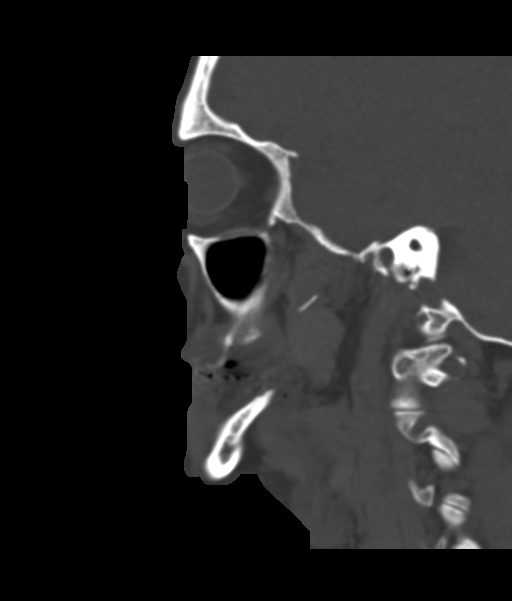

[14 of 47 positions shown; findings below may reference images not displayed]

FINDINGS: CT HEAD FINDINGS

Brain: Normal anatomic configuration. Parenchymal volume loss is
commensurate with the patient's age. No abnormal intra or
extra-axial mass lesion or fluid collection. No abnormal mass effect
or midline shift. No evidence of acute intracranial hemorrhage or
infarct. Ventricular size is normal. Cerebellum unremarkable.

Vascular: No asymmetric hyperdense vasculature at the skull base.

Skull: Intact

Other: Mastoid air cells and middle ear cavities are clear. Mild
prefrontal scalp soft tissue swelling.

CT MAXILLOFACIAL FINDINGS

Osseous: There is chronic deformity of the nasal bridge again
identified, however, there is superimposed acute fracture of the
nasal bones bilaterally with mild displacement.

Orbits: The ocular lenses have been removed. The orbits are
otherwise unremarkable.

Sinuses: The paranasal sinuses are clear.

Soft tissues: Mild soft tissue swelling superficial to the nasal
bridge.

CT CERVICAL SPINE FINDINGS

Alignment: Normal.  No listhesis.

Skull base and vertebrae: Craniocervical alignment is normal. The
atlantodental interval is not widened. No acute fracture of the
cervical spine.

Soft tissues and spinal canal: Posterior disc osteophyte complex in
combination with mild narrowing of the central spinal canal results
in mild central canal stenosis at C5-6 with an AP diameter of
approximately 8-9 mm. Mild flattening of the thecal sac. Milder
changes are noted at C3-4 and C4-5. No canal hematoma. No
prevertebral soft tissue swelling or fluid collections.

Disc levels: Intervertebral disc space narrowing and endplate
remodeling at C5-6 in keeping with moderate degenerative disc
disease. Milder degenerative changes are noted throughout the
remainder of the cervical spine. Prevertebral soft tissues are not
thickened on sagittal reformats. Review of the axial images
demonstrates multilevel uncovertebral and facet arthrosis resulting
in moderate to severe neuroforaminal narrowing on the left at C3-4
and C4-5, bilaterally at C5-6 and on the left at C6-7.

Upper chest: Unremarkable

Other: None
IMPRESSION: No acute intracranial abnormality. No calvarial fracture. Mild
frontal scalp soft tissue swelling.

Acute, mildly displaced fracture of the nasal bones bilaterally.
Mild overlying soft tissue swelling.

No acute fracture or listhesis of the cervical spine.

Multilevel degenerative disc and degenerative joint disease within
the cervical spine resulting in multilevel mild central canal
stenosis and moderate to severe neuroforaminal narrowing as
described above.
